# Patient Record
Sex: Male | Born: 1965 | Hispanic: No | Marital: Single | Smoking: Never smoker
Health system: Southern US, Community
[De-identification: ages and names within clinical notes are randomized; demographics above are authoritative.]

## PROBLEM LIST (undated history)

## (undated) DIAGNOSIS — R972 Elevated prostate specific antigen [PSA]: Secondary | ICD-10-CM

## (undated) DIAGNOSIS — N400 Enlarged prostate without lower urinary tract symptoms: Secondary | ICD-10-CM

## (undated) DIAGNOSIS — K219 Gastro-esophageal reflux disease without esophagitis: Secondary | ICD-10-CM

## (undated) DIAGNOSIS — Z8611 Personal history of tuberculosis: Secondary | ICD-10-CM

## (undated) DIAGNOSIS — Z8489 Family history of other specified conditions: Secondary | ICD-10-CM

## (undated) DIAGNOSIS — J45909 Unspecified asthma, uncomplicated: Secondary | ICD-10-CM

## (undated) DIAGNOSIS — T8859XA Other complications of anesthesia, initial encounter: Secondary | ICD-10-CM

## (undated) DIAGNOSIS — M5431 Sciatica, right side: Secondary | ICD-10-CM

## (undated) HISTORY — DX: Elevated prostate specific antigen (PSA): R97.20

## (undated) HISTORY — PX: NO PAST SURGERIES: SHX2092

---

## 2003-12-29 ENCOUNTER — Other Ambulatory Visit: Payer: Self-pay

## 2005-05-29 ENCOUNTER — Emergency Department: Payer: Self-pay | Admitting: Emergency Medicine

## 2005-06-06 ENCOUNTER — Emergency Department: Payer: Self-pay | Admitting: Emergency Medicine

## 2005-06-06 ENCOUNTER — Other Ambulatory Visit: Payer: Self-pay

## 2005-06-08 ENCOUNTER — Ambulatory Visit: Payer: Self-pay | Admitting: Emergency Medicine

## 2005-08-05 ENCOUNTER — Other Ambulatory Visit: Payer: Self-pay

## 2005-08-05 ENCOUNTER — Emergency Department: Payer: Self-pay | Admitting: General Practice

## 2005-09-20 ENCOUNTER — Ambulatory Visit: Payer: Self-pay | Admitting: Gastroenterology

## 2006-02-26 ENCOUNTER — Emergency Department: Payer: Self-pay | Admitting: Emergency Medicine

## 2006-02-26 ENCOUNTER — Other Ambulatory Visit: Payer: Self-pay

## 2006-11-07 ENCOUNTER — Other Ambulatory Visit: Payer: Self-pay

## 2006-11-07 ENCOUNTER — Emergency Department: Payer: Self-pay | Admitting: Unknown Physician Specialty

## 2006-11-12 ENCOUNTER — Emergency Department: Payer: Self-pay | Admitting: Emergency Medicine

## 2006-12-23 ENCOUNTER — Emergency Department: Payer: Self-pay | Admitting: Emergency Medicine

## 2007-08-17 ENCOUNTER — Emergency Department: Payer: Self-pay | Admitting: Emergency Medicine

## 2008-01-16 ENCOUNTER — Inpatient Hospital Stay: Payer: Self-pay | Admitting: Internal Medicine

## 2008-01-16 ENCOUNTER — Other Ambulatory Visit: Payer: Self-pay

## 2009-02-17 ENCOUNTER — Emergency Department: Payer: Self-pay | Admitting: Emergency Medicine

## 2009-10-28 ENCOUNTER — Emergency Department: Payer: Self-pay | Admitting: Internal Medicine

## 2010-02-01 ENCOUNTER — Emergency Department: Payer: Self-pay | Admitting: Emergency Medicine

## 2010-05-26 ENCOUNTER — Emergency Department: Payer: Self-pay | Admitting: Emergency Medicine

## 2010-06-01 ENCOUNTER — Emergency Department: Payer: Self-pay | Admitting: Emergency Medicine

## 2010-08-08 ENCOUNTER — Emergency Department: Payer: Self-pay | Admitting: Emergency Medicine

## 2010-08-26 ENCOUNTER — Emergency Department: Payer: Self-pay | Admitting: Emergency Medicine

## 2011-04-05 ENCOUNTER — Emergency Department: Payer: Self-pay | Admitting: Emergency Medicine

## 2011-11-06 ENCOUNTER — Emergency Department: Payer: Self-pay | Admitting: *Deleted

## 2011-11-12 ENCOUNTER — Emergency Department: Payer: Self-pay | Admitting: Emergency Medicine

## 2012-05-02 ENCOUNTER — Ambulatory Visit: Payer: Self-pay | Admitting: Physical Medicine and Rehabilitation

## 2012-05-19 ENCOUNTER — Emergency Department: Payer: Self-pay | Admitting: Emergency Medicine

## 2012-05-20 LAB — CBC
HCT: 39.1 % — ABNORMAL LOW (ref 40.0–52.0)
HGB: 13.6 g/dL (ref 13.0–18.0)
MCH: 31.9 pg (ref 26.0–34.0)
MCV: 92 fL (ref 80–100)
Platelet: 173 10*3/uL (ref 150–440)
RBC: 4.27 10*6/uL — ABNORMAL LOW (ref 4.40–5.90)
RDW: 13.1 % (ref 11.5–14.5)
WBC: 3.6 10*3/uL — ABNORMAL LOW (ref 3.8–10.6)

## 2012-05-20 LAB — TROPONIN I
Troponin-I: <0.02 ng/mL
Troponin-I: <0.02 ng/mL

## 2012-05-20 LAB — BASIC METABOLIC PANEL
Calcium, Total: 8.8 mg/dL (ref 8.5–10.1)
EGFR (Non-African Amer.): >60
Osmolality: 281 (ref 275–301)
Potassium: 3.6 mmol/L (ref 3.5–5.1)

## 2012-05-20 LAB — CK TOTAL AND CKMB (NOT AT ARMC)
CK, Total: 460 U/L — ABNORMAL HIGH (ref 35–232)
CK, Total: 545 U/L — ABNORMAL HIGH (ref 35–232)
CK-MB: 1.6 ng/mL (ref 0.5–3.6)

## 2012-11-05 ENCOUNTER — Emergency Department: Payer: Self-pay | Admitting: Emergency Medicine

## 2012-11-05 LAB — CBC
HCT: 39.6 % — ABNORMAL LOW (ref 40.0–52.0)
MCH: 31.8 pg (ref 26.0–34.0)
MCHC: 35.7 g/dL (ref 32.0–36.0)
RBC: 4.45 10*6/uL (ref 4.40–5.90)

## 2012-11-05 LAB — TROPONIN I: Troponin-I: <0.02 ng/mL

## 2012-11-05 LAB — BASIC METABOLIC PANEL
Anion Gap: 7 (ref 7–16)
BUN: 10 mg/dL (ref 7–18)
Calcium, Total: 8.6 mg/dL (ref 8.5–10.1)
Chloride: 104 mmol/L (ref 98–107)
Co2: 27 mmol/L (ref 21–32)
Creatinine: 0.97 mg/dL (ref 0.60–1.30)
EGFR (African American): >60
EGFR (Non-African Amer.): >60
Glucose: 106 mg/dL — ABNORMAL HIGH (ref 65–99)
Osmolality: 275 (ref 275–301)
Potassium: 3.4 mmol/L — ABNORMAL LOW (ref 3.5–5.1)

## 2013-05-17 ENCOUNTER — Emergency Department: Payer: Self-pay | Admitting: Emergency Medicine

## 2013-05-17 LAB — BASIC METABOLIC PANEL
Anion Gap: 6 — ABNORMAL LOW (ref 7–16)
BUN: 18 mg/dL (ref 7–18)
Co2: 26 mmol/L (ref 21–32)
Creatinine: 1.26 mg/dL (ref 0.60–1.30)
EGFR (African American): >60
Glucose: 89 mg/dL (ref 65–99)
Osmolality: 275 (ref 275–301)
Potassium: 4.1 mmol/L (ref 3.5–5.1)
Sodium: 137 mmol/L (ref 136–145)

## 2013-05-17 LAB — DRUG SCREEN, URINE
Amphetamines, Ur Screen: NEGATIVE (ref ?–1000)
Benzodiazepine, Ur Scrn: NEGATIVE (ref ?–200)
Cannabinoid 50 Ng, Ur ~~LOC~~: NEGATIVE (ref ?–50)
Cocaine Metabolite,Ur ~~LOC~~: NEGATIVE (ref ?–300)
MDMA (Ecstasy)Ur Screen: NEGATIVE (ref ?–500)
Methadone, Ur Screen: NEGATIVE (ref ?–300)
Opiate, Ur Screen: NEGATIVE (ref ?–300)
Phencyclidine (PCP) Ur S: NEGATIVE (ref ?–25)

## 2013-05-17 LAB — TROPONIN I: Troponin-I: <0.02 ng/mL

## 2013-05-17 LAB — CBC
HGB: 13.6 g/dL (ref 13.0–18.0)
MCV: 90 fL (ref 80–100)
Platelet: 201 10*3/uL (ref 150–440)
RDW: 13.1 % (ref 11.5–14.5)

## 2013-06-21 ENCOUNTER — Emergency Department: Payer: Self-pay | Admitting: Emergency Medicine

## 2013-06-21 LAB — COMPREHENSIVE METABOLIC PANEL
Anion Gap: 5 — ABNORMAL LOW (ref 7–16)
Bilirubin,Total: 0.2 mg/dL (ref 0.2–1.0)
Calcium, Total: 9.1 mg/dL (ref 8.5–10.1)
Chloride: 108 mmol/L — ABNORMAL HIGH (ref 98–107)
Co2: 25 mmol/L (ref 21–32)
Creatinine: 1.11 mg/dL (ref 0.60–1.30)
EGFR (Non-African Amer.): >60
Glucose: 102 mg/dL — ABNORMAL HIGH (ref 65–99)
Osmolality: 277 (ref 275–301)
Potassium: 3.5 mmol/L (ref 3.5–5.1)
SGPT (ALT): 22 U/L (ref 12–78)
Sodium: 138 mmol/L (ref 136–145)
Total Protein: 7.7 g/dL (ref 6.4–8.2)

## 2013-06-21 LAB — CBC WITH DIFFERENTIAL/PLATELET
Comment - H1-Com1: NORMAL
Comment - H1-Com2: NORMAL
Eosinophil: 3 %
HCT: 40.4 % (ref 40.0–52.0)
HGB: 13.9 g/dL (ref 13.0–18.0)
Lymphocytes: 61 %
MCH: 31.1 pg (ref 26.0–34.0)
Monocytes: 10 %
Platelet: 198 10*3/uL (ref 150–440)
RBC: 4.49 10*6/uL (ref 4.40–5.90)
RDW: 13.1 % (ref 11.5–14.5)
Segmented Neutrophils: 26 %
WBC: 5.1 10*3/uL (ref 3.8–10.6)

## 2014-10-01 ENCOUNTER — Emergency Department: Payer: Self-pay | Admitting: Emergency Medicine

## 2016-09-02 ENCOUNTER — Encounter: Payer: Self-pay | Admitting: Emergency Medicine

## 2016-09-02 ENCOUNTER — Emergency Department
Admission: EM | Admit: 2016-09-02 | Discharge: 2016-09-02 | Disposition: A | Discharge status: Home / Self Care | Payer: BLUE CROSS/BLUE SHIELD | Attending: Emergency Medicine | Admitting: Emergency Medicine

## 2016-09-02 ENCOUNTER — Emergency Department: Payer: BLUE CROSS/BLUE SHIELD

## 2016-09-02 DIAGNOSIS — R200 Anesthesia of skin: Secondary | ICD-10-CM | POA: Insufficient documentation

## 2016-09-02 DIAGNOSIS — R079 Chest pain, unspecified: Secondary | ICD-10-CM | POA: Insufficient documentation

## 2016-09-02 DIAGNOSIS — R300 Dysuria: Secondary | ICD-10-CM | POA: Diagnosis not present

## 2016-09-02 DIAGNOSIS — R3915 Urgency of urination: Secondary | ICD-10-CM | POA: Diagnosis not present

## 2016-09-02 DIAGNOSIS — Z79899 Other long term (current) drug therapy: Secondary | ICD-10-CM | POA: Insufficient documentation

## 2016-09-02 DIAGNOSIS — R531 Weakness: Secondary | ICD-10-CM | POA: Diagnosis not present

## 2016-09-02 HISTORY — DX: Benign prostatic hyperplasia without lower urinary tract symptoms: N40.0

## 2016-09-02 HISTORY — DX: Gastro-esophageal reflux disease without esophagitis: K21.9

## 2016-09-02 LAB — URINALYSIS, COMPLETE (UACMP) WITH MICROSCOPIC
Bacteria, UA: NONE SEEN
Bilirubin Urine: NEGATIVE
Glucose, UA: NEGATIVE mg/dL
HGB URINE DIPSTICK: NEGATIVE
KETONES UR: NEGATIVE mg/dL
LEUKOCYTES UA: NEGATIVE
Nitrite: NEGATIVE
PROTEIN: NEGATIVE mg/dL
RBC / HPF: NONE SEEN RBC/hpf (ref 0–5)
Specific Gravity, Urine: 1.009 (ref 1.005–1.030)
Squamous Epithelial / LPF: NONE SEEN
pH: 8 (ref 5.0–8.0)

## 2016-09-02 LAB — CBC
HCT: 38.3 % — ABNORMAL LOW (ref 40.0–52.0)
HEMOGLOBIN: 13.5 g/dL (ref 13.0–18.0)
MCH: 32.1 pg (ref 26.0–34.0)
MCHC: 35.2 g/dL (ref 32.0–36.0)
MCV: 91 fL (ref 80.0–100.0)
Platelets: 188 10*3/uL (ref 150–440)
RBC: 4.2 MIL/uL — AB (ref 4.40–5.90)
RDW: 13.3 % (ref 11.5–14.5)
WBC: 3.4 10*3/uL — ABNORMAL LOW (ref 3.8–10.6)

## 2016-09-02 LAB — BASIC METABOLIC PANEL
ANION GAP: 4 — AB (ref 5–15)
BUN: 11 mg/dL (ref 6–20)
CALCIUM: 8.9 mg/dL (ref 8.9–10.3)
CHLORIDE: 103 mmol/L (ref 101–111)
CO2: 29 mmol/L (ref 22–32)
Creatinine, Ser: 0.98 mg/dL (ref 0.61–1.24)
GFR calc non Af Amer: >60 mL/min (ref >60)
Glucose, Bld: 113 mg/dL — ABNORMAL HIGH (ref 65–99)
Potassium: 3.5 mmol/L (ref 3.5–5.1)
Sodium: 136 mmol/L (ref 135–145)

## 2016-09-02 LAB — TROPONIN I
Troponin I: <0.03 ng/mL (ref <0.03)
Troponin I: <0.03 ng/mL (ref <0.03)

## 2016-09-02 LAB — GLUCOSE, CAPILLARY: GLUCOSE-CAPILLARY: 125 mg/dL — AB (ref 65–99)

## 2016-09-02 MED ORDER — IOPAMIDOL (ISOVUE-370) INJECTION 76%
100.0000 mL | Freq: Once | INTRAVENOUS | Status: AC | PRN
  Administered 2016-09-02: 100 mL via INTRAVENOUS

## 2016-09-02 MED ORDER — SODIUM CHLORIDE 0.9 % IV BOLUS (SEPSIS)
1000.0000 mL | Freq: Once | INTRAVENOUS | Status: AC
  Administered 2016-09-02: 1000 mL via INTRAVENOUS

## 2016-09-02 MED ORDER — ASPIRIN 81 MG PO CHEW
324.0000 mg | CHEWABLE_TABLET | Freq: Once | ORAL | Status: AC
Start: 2016-09-02 — End: 2016-09-02
  Administered 2016-09-02: 324 mg via ORAL
  Filled 2016-09-02: qty 4

## 2016-09-02 NOTE — ED Notes (Signed)
Pt states that at 10am his left arm got numb and started tingling and then his chest started hurting and radiated between shoulder blades - he states he felt tired - at 130pm he felt better and went to work - now the numbness has started again in left arm and he feels weak all over - Dr Don PerkingVeronese at bedside assessing pt

## 2016-09-02 NOTE — Discharge Instructions (Addendum)

## 2016-09-02 NOTE — ED Triage Notes (Addendum)
Pt reports weakness that started around 939-10. Pt states he also is worried there is something wrong with his prostate as he has been having problems urinating and feeling like he isn't emptying his bladder. Pt had hx of elevated PSA at age 51. Pt states prostate problems started around age 51. Pt states he is also having left arm numbness, but states his arm is painful 6/10 as well as the middle of his back.

## 2016-09-02 NOTE — ED Provider Notes (Signed)
[facility] Emergency Department Provider Note  ____________________________________________  Time seen: Approximately 7:03 PM  I have reviewed the triage vital signs and the nursing notes.   HISTORY  Chief Complaint Weakness and Urinary Retention   HPI Gary Nash is a 51 y.o. male with a history of GERD and BPH who presents for evaluation of chest pain. Patient reports that he woke up this morning and 9:30 AM feeling well. Around 10:30 AM he started feeling chest pain that he describes as a tingling sensation located in his chest, associated with numbness, radiating down his left arm, mild. He also felt extremely weak. He went back to bed. He woke up around 1:30 PM feeling the same however felt a little better last week and went into work. As the day went on patient continues to have the constant chest discomfort radiating to his left arm and started feeling again generalized body weakness. He then went to the bathroom because he had a sudden urge to urinate and reports that he had a little bit of pain when he urinated and felt like he was not able to urinate all of it. Patient reports that he has had intermittent trouble with urination for a while. Has had elevated PSA levels. Follow by PCP however has not seen PCP in a few years. Patient is not sure if he has fh of prostate cancer. He denies urinary or bowel incontinence or retention, low back pain, numbness or weakness of his lower extremities. He tells me that he feels better now. The chest pain has resolved, he is no longer feeling weak. His only complaint at this time is a mild pressure-like pain located in between of his shoulder blades on his upper back. Patient denies saddle anesthesia or trauma to his back. He has had chills today but no nausea, no vomiting, no diarrhea, no cough, no congestion, no shortness of breath. Patient is not a smoker. Denies personal or family history of ischemic heart  disease.  Past Medical History:  Diagnosis Date  . Benign prostate hyperplasia   . GERD (gastroesophageal reflux disease)     There are no active problems to display for this patient.   History reviewed. No pertinent surgical history.  Prior to Admission medications   Medication Sig Start Date End Date Taking? Authorizing Provider  BIOTIN PO Take 1 tablet by mouth daily.   Yes Historical Provider, MD  GARLIC PO Take 1 tablet by mouth daily.   Yes Historical Provider, MD  Multiple Vitamins-Minerals (HAIR SKIN AND NAILS FORMULA PO) Take 1 tablet by mouth daily.   Yes Historical Provider, MD  OVER THE COUNTER MEDICATION Take 1 tablet by mouth daily.   Yes Historical Provider, MD  TURMERIC PO Take by mouth.   Yes Historical Provider, MD    Allergies Patient has no known allergies.  History reviewed. No pertinent family history.  Social History Social History  Substance Use Topics  . Smoking status: Never Smoker  . Smokeless tobacco: Never Used  . Alcohol use Yes    Review of Systems  Constitutional: Negative for fever. + generalized weakness and chills Eyes: Negative for visual changes. ENT: Negative for sore throat. Neck: No neck pain  Cardiovascular: + chest pain. Respiratory: Negative for shortness of breath. Gastrointestinal: Negative for abdominal pain, vomiting or diarrhea. Genitourinary: + dysuria. Musculoskeletal: Negative for back pain. Skin: Negative for rash. Neurological: Negative for headaches, weakness or numbness. + chest and arm numbness Psych: No SI or HI  ____________________________________________   PHYSICAL EXAM:  VITAL SIGNS: Vitals:   09/02/16 2032  BP: (!) 139/91  Pulse: 61  Resp: 16    Constitutional: Alert and oriented, looks anxious. Well appearing and in no apparent distress. HEENT:      Head: Normocephalic and atraumatic.         Eyes: Conjunctivae are normal. Sclera is non-icteric. EOMI. PERRL      Mouth/Throat: Mucous  membranes are moist.       Neck: Supple with no signs of meningismus. Cardiovascular: Regular rate and rhythm. No murmurs, gallops, or rubs. 2+ symmetrical distal pulses are present in all extremities. No JVD. Respiratory: Normal respiratory effort. Lungs are clear to auscultation bilaterally. No wheezes, crackles, or rhonchi.  Gastrointestinal: Soft, non tender, and non distended with positive bowel sounds. No rebound or guarding. Genitourinary: No CVA tenderness. Musculoskeletal: Nontender with normal range of motion in all extremities. No edema, cyanosis, or erythema of extremities. Neurologic: Normal speech and language. Intact strength and sensation 4, 2+ DTRs on bilateral biceps and Achilles. Post void bladders scan showing 60cc Skin: Skin is warm, dry and intact. No rash noted. Psychiatric: Mood and affect are normal. Speech and behavior are normal.  ____________________________________________   LABS (all labs ordered are listed, but only abnormal results are displayed)  Labs Reviewed  BASIC METABOLIC PANEL - Abnormal; Notable for the following:       Result Value   Glucose, Bld 113 (*)    Anion gap 4 (*)    All other components within normal limits  CBC - Abnormal; Notable for the following:    WBC 3.4 (*)    RBC 4.20 (*)    HCT 38.3 (*)    All other components within normal limits  URINALYSIS, COMPLETE (UACMP) WITH MICROSCOPIC - Abnormal; Notable for the following:    Color, Urine STRAW (*)    APPearance CLEAR (*)    All other components within normal limits  GLUCOSE, CAPILLARY - Abnormal; Notable for the following:    Glucose-Capillary 125 (*)    All other components within normal limits  CHLAMYDIA/NGC RT PCR ([facility] ONLY)  TROPONIN I  CBG MONITORING, ED   ____________________________________________  EKG  ED ECG REPORT I, Nita Sickle, the attending physician, personally viewed and interpreted this ECG.  Normal sinus rhythm, rate of 65, normal  intervals, normal axis, no ST elevations or depressions. Normal EKG. ____________________________________________  RADIOLOGY  CTA chest: negative ____________________________________________   PROCEDURES  Procedure(s) performed: None Procedures Critical Care performed:  None ____________________________________________   INITIAL IMPRESSION / ASSESSMENT AND PLAN / ED COURSE   51 y.o. male with a history of GERD and BPH who presents for evaluation of multiple different medical complaints. Patient reports chills and generalized weakness since this morning, had chest numbness and tingling sensation radiating to his left arm, also has had dysuria. All symptoms had resolved during my evaluation. As I was testing patient's lower extremity strength he told me that his chest started to bother him again in the numbness was back. As soon as I stopped doing that the CP went away again. He has no lower back pain, normal neurological exam, and he is able to fully empty his bladder with a postvoid residual volume of 60 cc. He is also requesting to be checked for STDs. His presentation sounds atypical for ACS with constant chest pain since this morning mild and tingling. His EKG has no evidence of ischemia. His first troponin is negative. UA has no evidence  of urinary tract infection or kidney stones. Since patient has had some neurological complaints associated with his CP, will get CTA of the aorta to rule out dissection.Will give ASA. Will cycle cardiac enzymes.  Clinical Course as of Sep 02 2029  Fri Sep 02, 2016  2030 CTA chest negative. 2nd trop due at 2100. Patient remains stable, pain free. Care transferred to Dr. Mayford Knife.  [CV]    Clinical Course User Index [CV] Nita Sickle, MD    Pertinent labs & imaging results that were available during my care of the patient were reviewed by me and considered in my medical decision making (see chart for  details).    ____________________________________________   FINAL CLINICAL IMPRESSION(S) / ED DIAGNOSES  Final diagnoses:  Chest pain, unspecified type  Urinary urgency      NEW MEDICATIONS STARTED DURING THIS VISIT:  New Prescriptions   No medications on file     Note:  This document was prepared using Dragon voice recognition software and may include unintentional dictation errors.    Nita Sickle, MD 09/02/16 2034

## 2016-09-03 LAB — CHLAMYDIA/NGC RT PCR (ARMC ONLY)
CHLAMYDIA TR: NOT DETECTED
N GONORRHOEAE: NOT DETECTED

## 2016-09-15 ENCOUNTER — Other Ambulatory Visit: Payer: Self-pay

## 2016-09-15 ENCOUNTER — Telehealth: Payer: Self-pay

## 2016-09-15 ENCOUNTER — Emergency Department
Admission: EM | Admit: 2016-09-15 | Discharge: 2016-09-15 | Disposition: A | Discharge status: Home / Self Care | Payer: BLUE CROSS/BLUE SHIELD | Attending: Emergency Medicine | Admitting: Emergency Medicine

## 2016-09-15 ENCOUNTER — Emergency Department: Payer: BLUE CROSS/BLUE SHIELD

## 2016-09-15 ENCOUNTER — Encounter: Payer: Self-pay | Admitting: Emergency Medicine

## 2016-09-15 DIAGNOSIS — R519 Headache, unspecified: Secondary | ICD-10-CM

## 2016-09-15 DIAGNOSIS — R0789 Other chest pain: Secondary | ICD-10-CM | POA: Diagnosis not present

## 2016-09-15 DIAGNOSIS — R202 Paresthesia of skin: Secondary | ICD-10-CM | POA: Diagnosis not present

## 2016-09-15 DIAGNOSIS — R079 Chest pain, unspecified: Secondary | ICD-10-CM

## 2016-09-15 DIAGNOSIS — R51 Headache: Secondary | ICD-10-CM | POA: Diagnosis not present

## 2016-09-15 LAB — CBC
HCT: 40.4 % (ref 40.0–52.0)
Hemoglobin: 13.6 g/dL (ref 13.0–18.0)
MCH: 31.3 pg (ref 26.0–34.0)
MCHC: 33.7 g/dL (ref 32.0–36.0)
MCV: 92.8 fL (ref 80.0–100.0)
PLATELETS: 191 10*3/uL (ref 150–440)
RBC: 4.35 MIL/uL — ABNORMAL LOW (ref 4.40–5.90)
RDW: 13.5 % (ref 11.5–14.5)
WBC: 3.8 10*3/uL (ref 3.8–10.6)

## 2016-09-15 LAB — BASIC METABOLIC PANEL
Anion gap: 5 (ref 5–15)
BUN: 17 mg/dL (ref 6–20)
CHLORIDE: 102 mmol/L (ref 101–111)
CO2: 25 mmol/L (ref 22–32)
CREATININE: 0.96 mg/dL (ref 0.61–1.24)
Calcium: 9.4 mg/dL (ref 8.9–10.3)
GFR calc Af Amer: >60 mL/min (ref >60)
GFR calc non Af Amer: >60 mL/min (ref >60)
GLUCOSE: 103 mg/dL — AB (ref 65–99)
Potassium: 3.9 mmol/L (ref 3.5–5.1)
Sodium: 132 mmol/L — ABNORMAL LOW (ref 135–145)

## 2016-09-15 LAB — TROPONIN I
Troponin I: <0.03 ng/mL (ref <0.03)
Troponin I: <0.03 ng/mL (ref <0.03)

## 2016-09-15 MED ORDER — ASPIRIN 81 MG PO CHEW
324.0000 mg | CHEWABLE_TABLET | Freq: Once | ORAL | Status: AC
  Administered 2016-09-15: 324 mg via ORAL
  Filled 2016-09-15: qty 4

## 2016-09-15 MED ORDER — ASPIRIN 81 MG PO CHEW: 81.0000 mg | CHEWABLE_TABLET | Freq: Every day | ORAL | 0 refills | Status: DC

## 2016-09-15 NOTE — ED Notes (Signed)
Patient transported to CT 

## 2016-09-15 NOTE — ED Notes (Signed)
Gary Nash aware of patient placement in Room 26.

## 2016-09-15 NOTE — ED Provider Notes (Signed)
[facility] Emergency Department Provider Note  ____________________________________________   First MD Initiated Contact with Patient 09/15/16 0719     (approximate)  I have reviewed the triage vital signs and the nursing notes.   HISTORY  Chief Complaint Chest Pain; Headache; and Arm Pain   HPI Gary Nash is a 51 y.o. male with a history of BPH as well as GERD who is presenting to the emergency department today with left-sided chest numbness that started yesterday 1:30 PM. He also reports numbness to his left upper extremity extending down the lateral side into his left index finger. He is denying any chest pain at this time. Says the chest pain comes on randomly was started after sexual intercourse yesterday. Was also here in the emergency department on February 23 when the pain also started after sexual intercourse. He denies any shortness of breath, nausea or vomiting. Says the chest pain has come and gone since 1:30 PM yesterday and last for about 1-2 minutes each time. Says that his father had stents in his 13s from heart disease. The patient denies any smoking, drinking or drugs.  When the patient was here in the emergency department 2 weeks ago he had 2 sets of troponins as well as a CTA. All which were negative. He says that he makes of the days for his cardiology appointment and so did not follow up with cardiology in the office. Denies any injury. However, he does say that his left shoulder hurts with movement of the left upper extremity.  Patient also with a right-sided headache which she describes as a 6 out of 10 right now is a 7 out of 10 yesterday. He says that this started before he was having sexual intercourse.   Past Medical History:  Diagnosis Date  . Benign prostate hyperplasia   . GERD (gastroesophageal reflux disease)     There are no active problems to display for this patient.   History reviewed. No pertinent surgical  history.  Prior to Admission medications   Medication Sig Start Date End Date Taking? Authorizing Provider  BIOTIN PO Take 1 tablet by mouth daily.    Historical Provider, MD  GARLIC PO Take 1 tablet by mouth daily.    Historical Provider, MD  Multiple Vitamins-Minerals (HAIR SKIN AND NAILS FORMULA PO) Take 1 tablet by mouth daily.    Historical Provider, MD  OVER THE COUNTER MEDICATION Take 1 tablet by mouth daily.    Historical Provider, MD  TURMERIC PO Take by mouth.    Historical Provider, MD    Allergies Patient has no known allergies.  History reviewed. No pertinent family history.  Social History Social History  Substance Use Topics  . Smoking status: Never Smoker  . Smokeless tobacco: Never Used  . Alcohol use Yes    Review of Systems Constitutional: No fever/chills Eyes: No visual changes. ENT: No sore throat. Cardiovascular: as above Respiratory: Denies shortness of breath. Gastrointestinal: No abdominal pain.  No nausea, no vomiting.  No diarrhea.  No constipation. Genitourinary: Negative for dysuria. Musculoskeletal: Negative for back pain. Skin: Negative for rash. Neurological: Negative for focal weakness   10-point ROS otherwise negative.  ____________________________________________   PHYSICAL EXAM:  VITAL SIGNS: ED Triage Vitals [09/15/16 0502]  Enc Vitals Group     BP      Pulse      Resp      Temp      Temp src      SpO2  Weight 185 lb (83.9 kg)     Height 5\' 10"  (1.778 m)     Head Circumference      Peak Flow      Pain Score 3     Pain Loc      Pain Edu?      Excl. in GC?     Constitutional: Alert and oriented. Well appearing and in no acute distress. Eyes: Conjunctivae are normal. PERRL. EOMI. Head: Atraumatic. Nose: No congestion/rhinnorhea. Mouth/Throat: Mucous membranes are moist.   Neck: No stridor.   Cardiovascular: Normal rate, regular rhythm. Grossly normal heart sounds.  Good peripheral circulation With equal and  bilateral radial pulses. Respiratory: Normal respiratory effort.  No retractions. Lungs CTAB. Gastrointestinal: Soft and nontender. No distention. No abdominal bruits. No CVA tenderness. Musculoskeletal: No lower extremity tenderness nor edema.  No joint effusions. Neurologic:  Normal speech and language. No gross focal neurologic deficits are appreciated. No gait instability. No tenderness to palpation to the cervical spine. Patient reports numbness to his left index finger but none to his left ring finger. 5 out of 5 grip strength bilaterally. Skin:  Skin is warm, dry and intact. No rash noted. Psychiatric: Mood and affect are normal. Speech and behavior are normal.  ____________________________________________   LABS (all labs ordered are listed, but only abnormal results are displayed)  Labs Reviewed  BASIC METABOLIC PANEL - Abnormal; Notable for the following:       Result Value   Sodium 132 (*)    Glucose, Bld 103 (*)    All other components within normal limits  CBC - Abnormal; Notable for the following:    RBC 4.35 (*)    All other components within normal limits  TROPONIN I   ____________________________________________  EKG  ED ECG REPORT I, Arelia LongestSchaevitz,  David M, the attending physician, personally viewed and interpreted this ECG.   Date: 09/15/2016  EKG Time: 0502  Rate: 59  Rhythm: sinus bradycardia  Axis: normal axis  Intervals:none  ST&T Change: No ST segment elevation or depression. No abnormal T-wave inversion.  ____________________________________________  RADIOLOGY  DG Chest 2 View (Final result)  Result time 09/15/16 [id number]  Final result by Charlott Holleraniel Mitchell, MD (09/15/16 [id number]:41:[id number]:42)           Narrative:   CLINICAL DATA: Left-sided chest pain and left arm pain  EXAM: CHEST 2 VIEW  COMPARISON: 09/02/2016  FINDINGS: The heart size and mediastinal contours are within normal limits. Both lungs are clear. The visualized skeletal structures  are unremarkable.  IMPRESSION: No active cardiopulmonary disease.   Electronically Signed By: Ellery Plunkaniel R Mitchell M.D. On: 09/15/2016 05:28          CT Head Wo Contrast (Accession [id number]) (Order [id number])  Imaging  Date: 09/15/2016 Department: [facility] EMERGENCY DEPARTMENT Released By/Authorizing: Myrna Blazeravid Matthew Schaevitz, MD (auto-released)  Exam Information   Status Exam Begun  Exam Ended   Final [99] 09/15/2016 7:54 AM 09/15/2016 7:55 AM  PACS Images   Show images for CT Head Wo Contrast  Study Result   CLINICAL DATA:  Left arm pain and right-sided headaches  EXAM: CT HEAD WITHOUT CONTRAST  TECHNIQUE: Contiguous axial images were obtained from the base of the skull through the vertex without intravenous contrast.  COMPARISON:  08/26/2010  FINDINGS: Brain: No evidence of acute infarction, hemorrhage, hydrocephalus, extra-axial collection or mass lesion/mass effect.  Vascular: No hyperdense vessel or unexpected calcification.  Skull: Normal. Negative for fracture or focal lesion.  Sinuses/Orbits: No acute  finding.  Other: None.  IMPRESSION: No acute intracranial abnormality noted.   Electronically Signed   By: Alcide Clever M.D.   On: 09/15/2016 08:01      ____________________________________________   PROCEDURES  Procedure(s) performed:   Procedures  Critical Care performed:   ____________________________________________   INITIAL IMPRESSION / ASSESSMENT AND PLAN / ED COURSE  Pertinent labs & imaging results that were available during my care of the patient were reviewed by me and considered in my medical decision making (see chart for details).  PERC negative.  Heart score of 3.   Numbness does not follow any exact dermatomal distribution. However, because the numbness is only to have the fingers and he has left shoulder pain with movement it does seem like this could be related to nerve  irritation. It is a bit suspicious that his symptoms started after sex. He also has a right-sided headache which is complaining of.   ----------------------------------------- 8:12 AM on 09/15/2016 -----------------------------------------  Patient with reassuring CAT scan. Discussed case with Dr. Kirke Corin of cardiology who says that he'll be setting the patient up for stress test in the office. Patient also saying that the left arm numbness has been consistent since February. We will check second troponin. If negative he'll be discharged home. The patient understands this plan and is willing to comply. We'll also start him on a daily aspirin.  ____________________________________________   FINAL CLINICAL IMPRESSION(S) / ED DIAGNOSES  Paresthesia. Headache.    NEW MEDICATIONS STARTED DURING THIS VISIT:  New Prescriptions   No medications on file     Note:  This document was prepared using Dragon voice recognition software and may include unintentional dictation errors.    Myrna Blazer, MD 09/15/16 [phone]

## 2016-09-15 NOTE — Telephone Encounter (Signed)
Pt seen in ED for chest pain. Per Dr. Kirke Corin, he needs GXT ASAP and f/u appt. Attempted to contact pt. No answer, no voice mail.  Will call again.

## 2016-09-15 NOTE — ED Triage Notes (Signed)
Pt presents to ED by EMS with c/o left arm pain and a small amount of left sided chest pain. Onset yesterday. similar symptoms previously; last time was 2 weeks ago. Pt currently has no increased work of breathing or acute distress noted at this time. Pt recently seen in this ED for the same.

## 2016-09-15 NOTE — Telephone Encounter (Signed)
GXT scheduled 3/9, 10:30am Dr. Alvino ChapelIngal appt scheduled 3/15, 10:45am S/w pt who is agreeable to GXT on Friday. He will be notified of f/u appt date/time tomorrow when he comes for GXT.

## 2016-09-16 ENCOUNTER — Ambulatory Visit (INDEPENDENT_AMBULATORY_CARE_PROVIDER_SITE_OTHER): Payer: BLUE CROSS/BLUE SHIELD

## 2016-09-16 DIAGNOSIS — R079 Chest pain, unspecified: Secondary | ICD-10-CM | POA: Diagnosis not present

## 2016-09-16 LAB — EXERCISE TOLERANCE TEST
CHL CUP MPHR: 171 {beats}/min
CSEPEW: 12.2 METS
Exercise duration (min): 10 min
Exercise duration (sec): 18 s
Peak HR: 146 {beats}/min
Percent HR: 85 %
Rest HR: 65 {beats}/min

## 2016-09-22 ENCOUNTER — Encounter: Payer: Self-pay | Admitting: Cardiology

## 2016-09-22 ENCOUNTER — Ambulatory Visit (INDEPENDENT_AMBULATORY_CARE_PROVIDER_SITE_OTHER): Payer: BLUE CROSS/BLUE SHIELD | Admitting: Cardiology

## 2016-09-22 VITALS — BP 122/78 | HR 67 | Ht 70.0 in | Wt 182.0 lb

## 2016-09-22 DIAGNOSIS — R079 Chest pain, unspecified: Secondary | ICD-10-CM | POA: Diagnosis not present

## 2016-09-22 DIAGNOSIS — R0602 Shortness of breath: Secondary | ICD-10-CM

## 2016-09-22 NOTE — Patient Instructions (Addendum)
Testing/Procedures: Your physician has requested that you have an echocardiogram. Echocardiography is a painless test that uses sound waves to create images of your heart. It provides your doctor with information about the size and shape of your heart and how well your heart's chambers and valves are working. This procedure takes approximately one hour. There are no restrictions for this procedure.  [facility] MYOVIEW  Your caregiver has ordered a Stress Test with nuclear imaging. The purpose of this test is to evaluate the blood supply to your heart muscle. This procedure is referred to as a "Non-Invasive Stress Test." This is because other than having an IV started in your vein, nothing is inserted or "invades" your body. Cardiac stress tests are done to find areas of poor blood flow to the heart by determining the extent of coronary artery disease (CAD). Some patients exercise on a treadmill, which naturally increases the blood flow to your heart, while others who are  unable to walk on a treadmill due to physical limitations have a pharmacologic/chemical stress agent called Lexiscan . This medicine will mimic walking on a treadmill by temporarily increasing your coronary blood flow.   Please note: these test may take anywhere between 2-4 hours to complete  PLEASE REPORT TO [facility]  THE VOLUNTEERS AT THE FIRST DESK WILL DIRECT YOU WHERE TO GO  Date of Procedure:__Tuesday September 27, 2016 at 09:00AM__  Arrival Time for Procedure:_Arrive at 08:45 AM to register_   PLEASE NOTIFY THE OFFICE AT LEAST 24 HOURS IN ADVANCE IF YOU ARE UNABLE TO KEEP YOUR APPOINTMENT.  [phone] AND  PLEASE NOTIFY [facility] AT LEAST 24 HOURS IN ADVANCE IF YOU ARE UNABLE TO KEEP YOUR APPOINTMENT. [phone]  How to prepare for your Myoview test:  1. Do not eat or drink after midnight 2. No caffeine for 24 hours prior to test 3. No smoking 24 hours prior to test. 4. Your medication may  be taken with water.  If your doctor stopped a medication because of this test, do not take that medication. 5. Ladies, please do not wear dresses.  Skirts or pants are appropriate. Please wear a short sleeve shirt. 6. No perfume, cologne or lotion. 7. Wear comfortable walking shoes. No heels!   Follow-Up: Your physician recommends that you schedule a follow-up appointment as needed. We will call you with results and if needed schedule follow up at that time.   It was a pleasure seeing you today here in the office. Please do not hesitate to give us a call back if you have any further questions. [phone]  [place] CellarPamela A. RN, BSN    Echocardiogram An echocardiogram, or echocardiography, uses sound waves (ultrasound) to produce an image of your heart. The echocardiogram is simple, painless, obtained within a short period of time, and offers valuable information to your health care provider. The images from an echocardiogram can provide information such as:  Evidence of coronary artery disease (CAD).  Heart size.  Heart muscle function.  Heart valve function.  Aneurysm detection.  Evidence of a past heart attack.  Fluid buildup around the heart.  Heart muscle thickening.  Assess heart valve function. Tell a health care provider about:  Any allergies you have.  All medicines you are taking, including vitamins, herbs, eye drops, creams, and over-the-counter medicines.  Any problems you or family members have had with anesthetic medicines.  Any blood disorders you have.  Any surgeries you have had.  Any medical conditions you have.  Whether you are pregnant or may be pregnant. What happens before the procedure? No special preparation is needed. Eat and drink normally. What happens during the procedure?  In order to produce an image of your heart, gel will be applied to your chest and a wand-like tool (transducer) will be moved over your chest. The gel will help transmit  the sound waves from the transducer. The sound waves will harmlessly bounce off your heart to allow the heart images to be captured in real-time motion. These images will then be recorded.  You may need an IV to receive a medicine that improves the quality of the pictures. What happens after the procedure? You may return to your normal schedule including diet, activities, and medicines, unless your health care provider tells you otherwise. This information is not intended to replace advice given to you by your health care provider. Make sure you discuss any questions you have with your health care provider. Document Released: 06/24/2000 Document Revised: 02/13/2016 Document Reviewed: 03/04/2013 Elsevier Interactive Patient Education  2017 [employer]. Cardiac Nuclear Scan A cardiac nuclear scan is a test that measures blood flow to the heart when a person is resting and when he or she is exercising. The test looks for problems such as:  Not enough blood reaching a portion of the heart.  The heart muscle not working normally. You may need this test if:  You have heart disease.  You have had abnormal lab results.  You have had heart surgery or angioplasty.  You have chest pain.  You have shortness of breath. In this test, a radioactive dye (tracer) is injected into your bloodstream. After the tracer has traveled to your heart, an imaging device is used to measure how much of the tracer is absorbed by or distributed to various areas of your heart. This procedure is usually done at a hospital and takes 2-4 hours. Tell a health care provider about:  Any allergies you have.  All medicines you are taking, including vitamins, herbs, eye drops, creams, and over-the-counter medicines.  Any problems you or family members have had with the use of anesthetic medicines.  Any blood disorders you have.  Any surgeries you have had.  Any medical conditions you have.  Whether you are pregnant  or may be pregnant. What are the risks? Generally, this is a safe procedure. However, problems may occur, including:  Serious chest pain and heart attack. This is only a risk if the stress portion of the test is done.  Rapid heartbeat.  Sensation of warmth in your chest. This usually passes quickly. What happens before the procedure?  Ask your health care provider about changing or stopping your regular medicines. This is especially important if you are taking diabetes medicines or blood thinners.  Remove your jewelry on the day of the procedure. What happens during the procedure?  An IV tube will be inserted into one of your veins.  Your health care provider will inject a small amount of radioactive tracer through the tube.  You will wait for 20-40 minutes while the tracer travels through your bloodstream.  Your heart activity will be monitored with an electrocardiogram (ECG).  You will lie down on an exam table.  Images of your heart will be taken for about 15-20 minutes.  You may be asked to exercise on a treadmill or stationary bike. While you exercise, your heart's activity will be monitored with an ECG, and your blood pressure will be checked. If you are  unable to exercise, you may be given a medicine to increase blood flow to parts of your heart.  When blood flow to your heart has peaked, a tracer will again be injected through the IV tube.  After 20-40 minutes, you will get back on the exam table and have more images taken of your heart.  When the procedure is over, your IV tube will be removed. The procedure may vary among health care providers and hospitals. Depending on the type of tracer used, scans may need to be repeated 3-4 hours later. What happens after the procedure?  Unless your health care provider tells you otherwise, you may return to your normal schedule, including diet, activities, and medicines.  Unless your health care provider tells you otherwise,  you may increase your fluid intake. This will help flush the contrast dye from your body. Drink enough fluid to keep your urine clear or pale yellow.  It is up to you to get your test results. Ask your health care provider, or the department that is doing the test, when your results will be ready. Summary  A cardiac nuclear scan measures the blood flow to the heart when a person is resting and when he or she is exercising.  You may need this test if you are at risk for heart disease.  Tell your health care provider if you are pregnant.  Unless your health care provider tells you otherwise, increase your fluid intake. This will help flush the contrast dye from your body. Drink enough fluid to keep your urine clear or pale yellow. This information is not intended to replace advice given to you by your health care provider. Make sure you discuss any questions you have with your health care provider. Document Released: 07/22/2004 Document Revised: 06/29/2016 Document Reviewed: 06/05/2013 Elsevier Interactive Patient Education  2017 [employer].

## 2016-09-22 NOTE — Progress Notes (Signed)
Cardiology Office Note   Date:  09/22/2016   ID:  Gary Nash, DOB 1966/03/24, MRN [id number]  Referring Doctor:  No PCP Per Patient   Cardiologist:   Almond Lint, MD   Reason for consultation:  Chief Complaint  Patient presents with  . other    F/u stress test/ ED due to chest pain. Meds reviewed verbally with pt.      History of Present Illness: Gary Nash is a 51 y.o. male who presents for Chest pain. He is here after next size treadmill test.  Patient presented to the ER 09/16/2016 for chest pain, left arm pain. He was ruled out for ACS and was asked to follow-up with cardiology office. Before the initial visit, he was scheduled for an exercise stress test. There was deemed unremarkable.  Today, patient reports that he continues to have chest pain. Last was yesterday while he was changing a tire. He had discomfort in the center of the chest, associated with left arm pain, 6 out of 10 severity, resolved after 15-20 minutes of rest. He will also felt lightheaded and did not feel well at that time.  He continues to have shortness of breath with exertion as well.  Patient reports the father had heart disease in his 75s. On this had an MI in her 6s.   ROS:  Please see the history of present illness. Aside from mentioned under HPI, all other systems are reviewed and negative.     Past Medical History:  Diagnosis Date  . Benign prostate hyperplasia   . GERD (gastroesophageal reflux disease)     History reviewed. No pertinent surgical history.   reports that he has never smoked. He has never used smokeless tobacco. He reports that he drinks alcohol.   family history is not on file.   Outpatient Medications Prior to Visit  Medication Sig Dispense Refill  . Multiple Vitamins-Minerals (HAIR SKIN AND NAILS FORMULA PO) Take 1 tablet by mouth daily.    Marland Kitchen aspirin (ASPIRIN CHILDRENS) 81 MG chewable tablet Chew 1 tablet (81 mg total) by mouth daily. (Patient not  taking: Reported on 09/22/2016) 36 tablet 0  . BIOTIN PO Take 1 tablet by mouth daily.    Marland Kitchen GARLIC PO Take 1 tablet by mouth daily.    Marland Kitchen OVER THE COUNTER MEDICATION Take 1 tablet by mouth daily.    . TURMERIC PO Take by mouth.     No facility-administered medications prior to visit.      Allergies: Patient has no known allergies.    PHYSICAL EXAM: VS:  BP 122/78 (BP Location: Left Arm, Patient Position: Sitting, Cuff Size: Normal)   Pulse 67   Ht  (1.778 m)   Wt 182 lb (82.6 kg)   BMI 26.11 kg/m  , Body mass index is 26.11 kg/m. Wt Readings from Last 3 Encounters:  09/22/16 182 lb (82.6 kg)  09/15/16 185 lb (83.9 kg)  09/02/16 185 lb (83.9 kg)    GENERAL:  well developed, well nourished, not in acute distress HEENT: normocephalic, pink conjunctivae, anicteric sclerae, no xanthelasma, normal dentition, oropharynx clear NECK:  no neck vein engorgement, JVP normal, no hepatojugular reflux, carotid upstroke brisk and symmetric, no bruit, no thyromegaly, no lymphadenopathy LUNGS:  good respiratory effort, clear to auscultation bilaterally CV:  PMI not displaced, no thrills, no lifts, S1 and S2 within normal limits, no palpable S3 or S4, no murmurs, no rubs, no gallops ABD:  Soft, nontender, nondistended, normoactive bowel  sounds, no abdominal aortic bruit, no hepatomegaly, no splenomegaly MS: nontender back, no kyphosis, no scoliosis, no joint deformities EXT:  2+ DP/PT pulses, no edema, no varicosities, no cyanosis, no clubbing SKIN: warm, nondiaphoretic, normal turgor, no ulcers NEUROPSYCH: alert, oriented to person, place, and time, sensory/motor grossly intact, normal mood, appropriate affect  Recent Labs: 09/15/2016: BUN 17; Creatinine, Ser 0.96; Hemoglobin 13.6; Platelets 191; Potassium 3.9; Sodium 132   Lipid Panel No results found for: CHOL, TRIG, HDL, CHOLHDL, VLDL, LDLCALC, LDLDIRECT   Other studies Reviewed:  EKG:  The ekg from 09/16/2016 was personally reviewed  by me and it revealed sinus rhythm 59 BPM, sinus arrhythmia.  Additional studies/ records that were reviewed personally reviewed by me today include:  Exercise stress test 09/16/2016:  Blood pressure demonstrated a normal response to exercise.  There was no ST segment deviation noted during stress.  No T wave inversion was noted during stress.   Normal treadmill stress test with no evidence of ischemia. Excellent exercise capacity with normal blood pressure response to exercise. The patient exercised for 10 minutes and 18 seconds.   ASSESSMENT AND PLAN: Chest pain, shortness of breath Symptoms persist despite a negative treadmill test. Recommend to continue further evaluation with exercise nuclear stress test, symptoms or with exertion. Recommend echocardiogram as well. Continue aspirin 81 mg by mouth daily. Patient to call 911 for unrelenting chest pain.   Current medicines are reviewed at length with the patient today.  The patient does not have concerns regarding medicines.  Labs/ tests ordered today include:  Orders Placed This Encounter  Procedures  . NM Myocar Multi W/Spect W/Wall Motion / EF  . ECHOCARDIOGRAM COMPLETE    I had a lengthy and detailed discussion with the patient regarding diagnoses, prognosis, diagnostic options, treatment options , and side effects of medications.   I counseled the patient on importance of lifestyle modification including heart healthy diet, regular physical activity once cardiac workup is completed   Disposition:   FU with Cardiology after tests   Thank you for this consultation. We will forwarding this consultation to referring physician.   Signed, Almond Lint, MD  09/22/2016 2:03 PM    [facility]  This note was generated in part with voice recognition software and I apologize for any typographical errors that were not detected and corrected.

## 2016-09-27 ENCOUNTER — Ambulatory Visit
Admission: RE | Admit: 2016-09-27 | Discharge: 2016-09-27 | Disposition: A | Discharge status: Home / Self Care | Payer: BLUE CROSS/BLUE SHIELD | Source: Ambulatory Visit | Attending: Cardiology | Admitting: Cardiology

## 2016-09-27 ENCOUNTER — Telehealth: Payer: Self-pay | Admitting: Cardiology

## 2016-09-27 DIAGNOSIS — R079 Chest pain, unspecified: Secondary | ICD-10-CM

## 2016-09-27 DIAGNOSIS — R0602 Shortness of breath: Secondary | ICD-10-CM

## 2016-09-27 NOTE — Telephone Encounter (Signed)
Left voicemail message for patient to call back to discuss stress test.  

## 2016-09-27 NOTE — Telephone Encounter (Signed)
Left voicemail message to call back  

## 2016-09-27 NOTE — Telephone Encounter (Signed)
Nuc Med called, states pt did not have stress test performed due to high copay.

## 2016-09-28 NOTE — Telephone Encounter (Signed)
Left voicemail message to call back  

## 2016-09-29 ENCOUNTER — Encounter: Payer: Self-pay | Admitting: *Deleted

## 2016-09-29 NOTE — Telephone Encounter (Signed)
Sent patient letter to call back

## 2016-09-29 NOTE — Telephone Encounter (Signed)
Left detailed voicemail message to call back to discuss possible payment plans and other options for stress test.

## 2016-10-28 ENCOUNTER — Other Ambulatory Visit: Payer: BLUE CROSS/BLUE SHIELD

## 2016-10-28 ENCOUNTER — Encounter: Payer: Self-pay | Admitting: Cardiology

## 2016-11-21 DIAGNOSIS — Z5321 Procedure and treatment not carried out due to patient leaving prior to being seen by health care provider: Secondary | ICD-10-CM | POA: Diagnosis not present

## 2016-11-21 DIAGNOSIS — Z7982 Long term (current) use of aspirin: Secondary | ICD-10-CM | POA: Insufficient documentation

## 2016-11-21 DIAGNOSIS — R42 Dizziness and giddiness: Secondary | ICD-10-CM | POA: Insufficient documentation

## 2016-11-21 DIAGNOSIS — R51 Headache: Secondary | ICD-10-CM | POA: Diagnosis not present

## 2016-11-21 LAB — URINALYSIS, COMPLETE (UACMP) WITH MICROSCOPIC
BACTERIA UA: NONE SEEN
BILIRUBIN URINE: NEGATIVE
GLUCOSE, UA: NEGATIVE mg/dL
HGB URINE DIPSTICK: NEGATIVE
Ketones, ur: NEGATIVE mg/dL
LEUKOCYTES UA: NEGATIVE
NITRITE: NEGATIVE
PROTEIN: NEGATIVE mg/dL
SPECIFIC GRAVITY, URINE: 1.016 (ref 1.005–1.030)
Squamous Epithelial / LPF: NONE SEEN
WBC UA: NONE SEEN WBC/hpf (ref 0–5)
pH: 6 (ref 5.0–8.0)

## 2016-11-21 LAB — CBC
HEMATOCRIT: 41.7 % (ref 40.0–52.0)
HEMOGLOBIN: 14.4 g/dL (ref 13.0–18.0)
MCH: 30.9 pg (ref 26.0–34.0)
MCHC: 34.6 g/dL (ref 32.0–36.0)
MCV: 89.4 fL (ref 80.0–100.0)
Platelets: 203 10*3/uL (ref 150–440)
RBC: 4.66 MIL/uL (ref 4.40–5.90)
RDW: 13.1 % (ref 11.5–14.5)
WBC: 4.3 10*3/uL (ref 3.8–10.6)

## 2016-11-21 LAB — BASIC METABOLIC PANEL
ANION GAP: 5 (ref 5–15)
BUN: 13 mg/dL (ref 6–20)
CO2: 30 mmol/L (ref 22–32)
Calcium: 9.4 mg/dL (ref 8.9–10.3)
Chloride: 101 mmol/L (ref 101–111)
Creatinine, Ser: 1.06 mg/dL (ref 0.61–1.24)
Glucose, Bld: 94 mg/dL (ref 65–99)
POTASSIUM: 3.9 mmol/L (ref 3.5–5.1)
SODIUM: 136 mmol/L (ref 135–145)

## 2016-11-21 NOTE — ED Triage Notes (Signed)
Pt arrives to ED via POV with c/o "heat exposure", headache, "tiredness", and "tingling in my hands". Pt reports working outside today around 1230am when s/x's started. Pt took 81mg  ASA at 7pm. Pt reports s/x's have been intermittent since 1230 until 730 this evening. Pt denies cardiac history, no h/x of CVA. Pt is A&Ox4, in NAD, with respirations even regular and unlabored. Pt denies CP or SHOB, skin is warm and dry, color appropriate for ethnicity.

## 2016-11-22 ENCOUNTER — Encounter: Payer: Self-pay | Admitting: Cardiology

## 2016-11-22 ENCOUNTER — Emergency Department
Admission: EM | Admit: 2016-11-22 | Discharge: 2016-11-22 | Disposition: A | Discharge status: Home / Self Care | Payer: BLUE CROSS/BLUE SHIELD | Attending: Emergency Medicine | Admitting: Emergency Medicine

## 2016-11-22 ENCOUNTER — Telehealth: Payer: Self-pay | Admitting: Emergency Medicine

## 2016-11-22 NOTE — Telephone Encounter (Signed)
Called patient due to lwot to inquire about condition and follow up plans. Mailbox full, so unable to leave message. 

## 2016-12-06 ENCOUNTER — Emergency Department
Admission: EM | Admit: 2016-12-06 | Discharge: 2016-12-06 | Disposition: A | Discharge status: Home / Self Care | Payer: BLUE CROSS/BLUE SHIELD | Attending: Emergency Medicine | Admitting: Emergency Medicine

## 2016-12-06 ENCOUNTER — Emergency Department: Payer: BLUE CROSS/BLUE SHIELD

## 2016-12-06 ENCOUNTER — Encounter: Payer: Self-pay | Admitting: Emergency Medicine

## 2016-12-06 DIAGNOSIS — N5082 Scrotal pain: Secondary | ICD-10-CM | POA: Insufficient documentation

## 2016-12-06 DIAGNOSIS — Z7982 Long term (current) use of aspirin: Secondary | ICD-10-CM | POA: Diagnosis not present

## 2016-12-06 DIAGNOSIS — R079 Chest pain, unspecified: Secondary | ICD-10-CM | POA: Insufficient documentation

## 2016-12-06 DIAGNOSIS — R52 Pain, unspecified: Secondary | ICD-10-CM

## 2016-12-06 LAB — CBC
HEMATOCRIT: 41.3 % (ref 40.0–52.0)
HEMOGLOBIN: 14.1 g/dL (ref 13.0–18.0)
MCH: 31.2 pg (ref 26.0–34.0)
MCHC: 34.3 g/dL (ref 32.0–36.0)
MCV: 90.8 fL (ref 80.0–100.0)
Platelets: 212 10*3/uL (ref 150–440)
RBC: 4.54 MIL/uL (ref 4.40–5.90)
RDW: 13.3 % (ref 11.5–14.5)
WBC: 3.1 10*3/uL — ABNORMAL LOW (ref 3.8–10.6)

## 2016-12-06 LAB — URINALYSIS, COMPLETE (UACMP) WITH MICROSCOPIC
BACTERIA UA: NONE SEEN
Bilirubin Urine: NEGATIVE
Glucose, UA: NEGATIVE mg/dL
HGB URINE DIPSTICK: NEGATIVE
Ketones, ur: NEGATIVE mg/dL
Leukocytes, UA: NEGATIVE
Nitrite: NEGATIVE
PROTEIN: NEGATIVE mg/dL
SPECIFIC GRAVITY, URINE: 1.016 (ref 1.005–1.030)
SQUAMOUS EPITHELIAL / LPF: NONE SEEN
pH: 6 (ref 5.0–8.0)

## 2016-12-06 LAB — COMPREHENSIVE METABOLIC PANEL
ALT: 16 U/L — ABNORMAL LOW (ref 17–63)
AST: 21 U/L (ref 15–41)
Albumin: 4.4 g/dL (ref 3.5–5.0)
Alkaline Phosphatase: 52 U/L (ref 38–126)
Anion gap: 4 — ABNORMAL LOW (ref 5–15)
BUN: 9 mg/dL (ref 6–20)
CO2: 28 mmol/L (ref 22–32)
Calcium: 9.3 mg/dL (ref 8.9–10.3)
Chloride: 106 mmol/L (ref 101–111)
Creatinine, Ser: 0.93 mg/dL (ref 0.61–1.24)
GFR calc Af Amer: >60 mL/min (ref >60)
GFR calc non Af Amer: >60 mL/min (ref >60)
GLUCOSE: 87 mg/dL (ref 65–99)
POTASSIUM: 3.7 mmol/L (ref 3.5–5.1)
SODIUM: 138 mmol/L (ref 135–145)
Total Bilirubin: 0.5 mg/dL (ref 0.3–1.2)
Total Protein: 7.9 g/dL (ref 6.5–8.1)

## 2016-12-06 LAB — TROPONIN I

## 2016-12-06 LAB — LIPASE, BLOOD: Lipase: 31 U/L (ref 11–51)

## 2016-12-06 MED ORDER — IOPAMIDOL (ISOVUE-370) INJECTION 76%
75.0000 mL | Freq: Once | INTRAVENOUS | Status: AC | PRN
  Administered 2016-12-06: 75 mL via INTRAVENOUS

## 2016-12-06 MED ORDER — KETOROLAC TROMETHAMINE 30 MG/ML IJ SOLN
30.0000 mg | Freq: Once | INTRAMUSCULAR | Status: DC
  Filled 2016-12-06: qty 1

## 2016-12-06 NOTE — ED Triage Notes (Signed)
[facility] and lower back pain.

## 2016-12-06 NOTE — ED Notes (Addendum)
Pt taken to room 8 by pt relations rep Alfredia ClientMary Jo; report called to [facility], care nurse and notified of protocols that were performed in triage

## 2016-12-06 NOTE — ED Provider Notes (Addendum)
[facility] Emergency Department Provider Note   ____________________________________________   First MD Initiated Contact with Patient 12/06/16 1920     (approximate)  I have reviewed the triage vital signs and the nursing notes.   HISTORY  Chief Complaint Abdominal Pain and Dysuria    HPI Gary Nash is a 51 y.o. male patient complains of pain in the lower right chest starting yesterday. Patient reports it seems to be worse with movement or activity and better when he rests. He feels weak and tired all the time. He is not coughing he does not have a fever does not actually have any pain in his abdomen. He has no dysuria pain is not worse with deep breathing. Patient does feel slightly short of breath. Patient also complains of pain in the scrotum above the testicle its been there fairly constantly for month or 2. He has no dysuria pain there is mild.   Past Medical History:  Diagnosis Date  . Benign prostate hyperplasia   . GERD (gastroesophageal reflux disease)     There are no active problems to display for this patient.   History reviewed. No pertinent surgical history.  Prior to Admission medications   Medication Sig Start Date End Date Taking? Authorizing Provider  aspirin (ASPIRIN CHILDRENS) 81 MG chewable tablet Chew 1 tablet (81 mg total) by mouth daily. Patient not taking: Reported on 09/22/2016 09/15/16 09/15/17  Myrna Blazer, MD  Multiple Vitamins-Minerals (HAIR SKIN AND NAILS FORMULA PO) Take 1 tablet by mouth daily.    [provider]  NON FORMULARY Testosterone Booster qd. (P6 alternate)    [provider]    Allergies Patient has no known allergies.  No family history on file.  Social History Social History  Substance Use Topics  . Smoking status: Never Smoker  . Smokeless tobacco: Never Used  . Alcohol use Yes    Review of Systems  Constitutional: No fever/chills Eyes: No visual  changes. ENT: No sore throat. Cardiovascular: See history of present illness Respiratory: See history of present illness Gastrointestinal: No abdominal pain.  No nausea, no vomiting.  No diarrhea.  No constipation. Genitourinary: Negative for dysuria. Musculoskeletal: Negative for back pain. Skin: Negative for rash. Neurological: Negative for headaches, focal weakness or numbness.   ____________________________________________   PHYSICAL EXAM:  VITAL SIGNS: ED Triage Vitals  Enc Vitals Group     BP 12/06/16 1719 124/80     Pulse Rate 12/06/16 1719 (!) 58     Resp 12/06/16 1719 16     Temp 12/06/16 1719 98.3 F (36.8 C)     Temp Source 12/06/16 1719 Oral     SpO2 12/06/16 1719 99 %     Weight 12/06/16 1718 175 lb (79.4 kg)     Height 12/06/16 1718 5\' 10"  (1.778 m)     Head Circumference --      Peak Flow --      Pain Score 12/06/16 1718 7     Pain Loc --      Pain Edu? --      Excl. in GC? --     Constitutional: Alert and oriented. Well appearing and in no acute distress. Eyes: Conjunctivae are normal. PERRL. EOMI. Head: Atraumatic. Nose: No congestion/rhinnorhea. Mouth/Throat: Mucous membranes are moist.  Oropharynx non-erythematous. Neck: No stridor.   Cardiovascular: Normal rate, regular rhythm. Grossly normal heart sounds.  Good peripheral circulation. Respiratory: Normal respiratory effort.  No retractions. Lungs CTAB. Gastrointestinal: Soft and nontender.  No distention. No abdominal bruits. No CVA tenderness. Genitourinary: Normal uncircumcised male no discharge testicles are descended nontender with normal Rectal: Normal rectal exam nontender prostate is normal Musculoskeletal: No lower extremity tenderness nor edema.  No joint effusions. Neurologic:  Normal speech and language. No gross focal neurologic deficits are appreciated. No gait instability. Skin:  Skin is warm, dry and intact. No rash noted. Psychiatric: Mood and affect are normal. Speech and behavior  are normal.  ____________________________________________   LABS (all labs ordered are listed, but only abnormal results are displayed)  Labs Reviewed  COMPREHENSIVE METABOLIC PANEL - Abnormal; Notable for the following:       Result Value   ALT 16 (*)    Anion gap 4 (*)    All other components within normal limits  CBC - Abnormal; Notable for the following:    WBC 3.1 (*)    All other components within normal limits  URINALYSIS, COMPLETE (UACMP) WITH MICROSCOPIC - Abnormal; Notable for the following:    Color, Urine YELLOW (*)    APPearance CLEAR (*)    All other components within normal limits  LIPASE, BLOOD  TROPONIN I   ____________________________________________  [facility] and interpreted by me shows normal sinus rhythm rate of 60 left axis no acute ST-T wave changes ____________________________________________  RADIOLOGY  Chest CT IMPRESSION: No cause of the presenting symptoms is identified. No pulmonary emboli or other acute chest pathology. Few tiny calcified left hilar nodes without visible pulmonary granuloma.   Electronically Signed   By: Paulina FusiMark  Shogry M.D.   On: 12/06/2016 20:15 ____________________________________________   PROCEDURES  Procedure(s) performed:   Procedures  Critical Care performed:   ____________________________________________   INITIAL IMPRESSION / ASSESSMENT AND PLAN / ED COURSE  Pertinent labs & imaging results that were available during my care of the patient were reviewed by me and considered in my medical decision making (see chart for details). There is still no pain on palpation of the patient's abdomen. Chest CT looks good. Troponin is negative EKG is normal patient has had a cardiac workup in the past that was unrevealing. Not sure what is going on with the patient but he appears to be healthy. The pain in his scrotal area is chronic his prostate exam is normal as tested testes are normal bilaterally   Clinical  Course as of Dec 06 2032  Tue Dec 06, 2016  1920 Chloride: 106 [PM]  1922 RBC: 4.54 [PM]    Clinical Course User Index [PM] Arnaldo NatalMalinda, Romaldo Saville F, MD     ____________________________________________   FINAL CLINICAL IMPRESSION(S) / ED DIAGNOSES  Final diagnoses:  Pain  Chest pain, unspecified type      NEW MEDICATIONS STARTED DURING THIS VISIT:  New Prescriptions   No medications on file     Note:  This document was prepared using Dragon voice recognition software and may include unintentional dictation errors.    Arnaldo NatalMalinda, Haillie Radu F, MD 12/06/16 2034    Arnaldo NatalMalinda, Analynn Daum F, MD 12/06/16 2035

## 2016-12-06 NOTE — Discharge Instructions (Signed)
Please follow-up with your regular doctor. Use Advil or Tylenol for the pain. I'm not sure what the pain is from but I do not believe it is your heart and there is no sign of any pneumonia or blood clot or anything else. Her lab work including your abdominal lab work looks good as well. Please return if you're worse if you develop fever vomiting or feels sicker.

## 2016-12-06 NOTE — ED Notes (Addendum)
MD at bedside. 

## 2016-12-06 NOTE — ED Triage Notes (Signed)
C/O right upper quadrant pain radiating to back since Saturday.  Pain is intermittent.  Describes nausea with pain.  Also c/o intermittent scrotal pain x 1 month.  Describes urinary urgency.

## 2016-12-06 NOTE — ED Notes (Signed)
Patient transported to CT 

## 2016-12-06 NOTE — ED Notes (Signed)
RN called lab to inform of troponin add on.

## 2017-04-27 DIAGNOSIS — D72819 Decreased white blood cell count, unspecified: Secondary | ICD-10-CM | POA: Insufficient documentation

## 2017-05-16 NOTE — Progress Notes (Signed)
05/17/2017 10:47 PM   Gary GottronBobby Nash Monrreal 1965-10-31 [id number]  Referring provider: [facility] [address] [place] [phone]  Chief Complaint  Patient presents with  . Urinary Frequency    HPI: Patient is a 51 year old African American male with a history of elevated PSA and BPH with LU TS who presents today as a referral from Dr. Richardine ServiceBehling.    History of elevated PSA Patient underwent a biopsy in 2010 for an elevated PSA.  He is unsure of the PSA value.  His most recent PSA was 1.46 ng/mL on 04/27/2017.    BPH WITH LUTS  (prostate and/or bladder) His IPSS score today is 14, which is moderate lower urinary tract symptomatology.  He is mixed with his quality life due to his urinary symptoms. His PVR is 54 mL.   His major complaints today are urgency, nocturia and a weak urinary stream.  He has had these symptoms for several months.  He denies any dysuria, hematuria or suprapubic pain.  He also denies any recent fevers, chills, nausea or vomiting.   He does not have a family history of PCa. IPSS    Row Name 05/17/17 1500         International Prostate Symptom Score   How often have you had the sensation of not emptying your bladder?  About half the time     How often have you had to urinate less than every two hours?  Not at All     How often have you found you stopped and started again several times when you urinated?  Not at All     How often have you found it difficult to postpone urination?  About half the time     How often have you had a weak urinary stream?  Almost always     How often have you had to strain to start urination?  Not at All     How many times did you typically get up at night to urinate?  3 Times     Total IPSS Score  14       Quality of Life due to urinary symptoms   If you were to spend the rest of your life with your urinary condition just the way it is now how would you feel about that?  Mixed        Score:  1-7  Mild 8-19 Moderate 20-35 Severe  He has also started to feel increased fatigue after sexual intercourse.      PMH: Past Medical History:  Diagnosis Date  . Benign prostate hyperplasia   . Elevated PSA   . GERD (gastroesophageal reflux disease)     Surgical History: Past Surgical History:  Procedure Laterality Date  . NO PAST SURGERIES      Home Medications:  Allergies as of 05/17/2017   No Known Allergies     Medication List        Accurate as of 05/17/17 11:59 PM. Always use your most recent med list.          cholecalciferol 400 units Tabs tablet Commonly known as:  VITAMIN Nash Take 400 Units by mouth.   Selenium 100 MCG Caps Take 1 capsule daily by mouth.   UNABLE TO FIND Take 1 capsule daily by mouth. Med Name: B-17   vitamin C 1000 MG tablet Take 1 tablet daily by mouth.       Allergies: No Known Allergies  Family History: Family  History  Problem Relation Age of Onset  . Erectile dysfunction Father   . Diabetes Father   . Healthy Mother   . Hypertension Brother     Social History:  reports that  has never smoked. he has never used smokeless tobacco. He reports that he drinks alcohol. He reports that he does not use drugs.  ROS: UROLOGY Frequent Urination?: No Hard to postpone urination?: Yes Burning/pain with urination?: No Get up at night to urinate?: Yes Leakage of urine?: No Urine stream starts and stops?: No Trouble starting stream?: No Do you have to strain to urinate?: No Blood in urine?: No Urinary tract infection?: No Sexually transmitted disease?: No Injury to kidneys or bladder?: No Painful intercourse?: No Weak stream?: Yes Erection problems?: No Penile pain?: No  Gastrointestinal Nausea?: No Vomiting?: No Indigestion/heartburn?: Yes Diarrhea?: No Constipation?: No  Constitutional Fever: No Night sweats?: No Weight loss?: No Fatigue?: Yes  Skin Skin rash/lesions?: No Itching?: No  Eyes Blurred vision?:  Yes Double vision?: No  Ears/Nose/Throat Sore throat?: Yes Sinus problems?: Yes  Hematologic/Lymphatic Swollen glands?: Yes Easy bruising?: No  Cardiovascular Leg swelling?: No Chest pain?: Yes  Respiratory Cough?: Yes Shortness of breath?: No  Endocrine Excessive thirst?: Yes  Musculoskeletal Back pain?: Yes Joint pain?: Yes  Neurological Headaches?: No Dizziness?: No  Psychologic Depression?: No Anxiety?: Yes  Physical Exam: BP 137/90   Pulse 74   Ht 5\' 10"  (1.778 m)   Wt 176 lb 14.4 oz (80.2 kg)   BMI 25.38 kg/m   Constitutional: Well nourished. Alert and oriented, No acute distress. HEENT: [place] AT, moist mucus membranes. Trachea midline, no masses. Cardiovascular: No clubbing, cyanosis, or edema. Respiratory: Normal respiratory effort, no increased work of breathing. GI: Abdomen is soft, non tender, non distended, no abdominal masses. Liver and spleen not palpable.  No hernias appreciated.  Stool sample for occult testing is not indicated.   GU: No CVA tenderness.  No bladder fullness or masses.  Patient with normal phallus.  Urethral meatus is patent.  No penile discharge. No penile lesions or rashes. Scrotum without lesions, cysts, rashes and/or edema.  Testicles are located scrotally bilaterally. No masses are appreciated in the testicles. Left and right epididymis are normal. Rectal: Patient with  normal sphincter tone. Anus and perineum without scarring or rashes. No rectal masses are appreciated. Prostate is approximately 50 grams, no nodules are appreciated. Seminal vesicles are normal. Skin: No rashes, bruises or suspicious lesions. Lymph: No cervical or inguinal adenopathy. Neurologic: Grossly intact, no focal deficits, moving all 4 extremities. Psychiatric: Normal mood and affect.  Laboratory Data: Lab Results  Component Value Date   WBC 2.6 (L) 05/17/2017   HGB 13.6 05/17/2017   HCT 38.8 05/17/2017   MCV 88 05/17/2017   PLT 218 05/17/2017     Lab Results  Component Value Date   CREATININE 0.93 12/06/2016    Lab Results  Component Value Date   AST 21 12/06/2016   Lab Results  Component Value Date   ALT 16 (L) 12/06/2016    Urinalysis    Component Value Date/Time   COLORURINE YELLOW (A) 12/06/2016 1717   APPEARANCEUR CLEAR (A) 12/06/2016 1717   LABSPEC 1.016 12/06/2016 1717   PHURINE 6.0 12/06/2016 1717   GLUCOSEU NEGATIVE 12/06/2016 1717   HGBUR NEGATIVE 12/06/2016 1717   BILIRUBINUR NEGATIVE 12/06/2016 1717   KETONESUR NEGATIVE 12/06/2016 1717   PROTEINUR NEGATIVE 12/06/2016 1717   NITRITE NEGATIVE 12/06/2016 1717   LEUKOCYTESUR NEGATIVE 12/06/2016 1717  I have reviewed the labs.   Pertinent Imaging: Results for Gary GottronROGERS, Gary Nash (MRN [id number]) as of 05/29/2017 22:42  Ref. Range 05/17/2017 15:23  Scan Result Unknown 54ml    Assessment & Plan:    1. History of elevated PSA  - current PSA is 1.46  - RTC in one year for PSA and exam  2. BPH with LUTS  - IPSS score is 11/3  - Continue conservative management, avoiding bladder irritants and timed voiding's  - most bothersome symptoms is/are urgency and nocturia  - RTC in months for IPSS, PSA, PVR and exam   3. Nocturia  - I explained to the patient that nocturia is often multi-factorial and difficult to treat.  Sleeping disorders, heart conditions, peripheral vascular disease, diabetes, an enlarged prostate for men, an urethral stricture causing bladder outlet obstruction and/or certain medications can contribute to nocturia.  - I have suggested that the patient avoid caffeine after noon and alcohol in the evening.  He or she may also benefit from fluid restrictions after 6:00 in the evening and voiding just prior to bedtime.  - I have explained that research studies have showed that over 84% of patients with sleep apnea reported frequent nighttime urination.   With sleep apnea, oxygen decreases, carbon dioxide increases, the blood become more  acidic, the heart rate drops and blood vessels in the lung constrict.  The body is then alerted that something is very wrong. The sleeper must wake enough to reopen the airway. By this time, the heart is racing and experiences a false signal of fluid overload. The heart excretes a hormone-like protein that tells the body to get rid of sodium and water, resulting in nocturia.  -  I also informed the patient that a recent study noted that decreasing sodium intake to 2.3 grams daily, if they don't have issues with hyponatremia, can also reduce the number of nightly voids  - The patient may benefit from a discussion with his or her primary care physician to see if he or she has risk factors for sleep apnea or other sleep disturbances and obtaining a sleep study.   Return for pending testosterone levels.  These notes generated with voice recognition software. I apologize for typographical errors.  Michiel CowboySHANNON Trey Bebee, PA-C  [facility] [address], [address] [place], [place] [address] [phone]

## 2017-05-17 ENCOUNTER — Encounter: Payer: Self-pay | Admitting: Urology

## 2017-05-17 ENCOUNTER — Ambulatory Visit (INDEPENDENT_AMBULATORY_CARE_PROVIDER_SITE_OTHER): Payer: BLUE CROSS/BLUE SHIELD | Admitting: Urology

## 2017-05-17 VITALS — BP 137/90 | HR 74 | Ht 70.0 in | Wt 176.9 lb

## 2017-05-17 DIAGNOSIS — R351 Nocturia: Secondary | ICD-10-CM

## 2017-05-17 DIAGNOSIS — N401 Enlarged prostate with lower urinary tract symptoms: Secondary | ICD-10-CM | POA: Diagnosis not present

## 2017-05-17 DIAGNOSIS — Z87898 Personal history of other specified conditions: Secondary | ICD-10-CM | POA: Diagnosis not present

## 2017-05-17 DIAGNOSIS — N138 Other obstructive and reflux uropathy: Secondary | ICD-10-CM

## 2017-05-17 LAB — BLADDER SCAN AMB NON-IMAGING

## 2017-05-18 LAB — CBC WITH DIFFERENTIAL/PLATELET
BASOS ABS: 0 10*3/uL (ref 0.0–0.2)
BASOS: 0 %
EOS (ABSOLUTE): 0 10*3/uL (ref 0.0–0.4)
Eos: 0 %
HEMATOCRIT: 38.8 % (ref 37.5–51.0)
Hemoglobin: 13.6 g/dL (ref 13.0–17.7)
Immature Grans (Abs): 0 10*3/uL (ref 0.0–0.1)
Immature Granulocytes: 0 %
LYMPHS ABS: 1.4 10*3/uL (ref 0.7–3.1)
Lymphs: 55 %
MCH: 30.8 pg (ref 26.6–33.0)
MCHC: 35.1 g/dL (ref 31.5–35.7)
MCV: 88 fL (ref 79–97)
Monocytes Absolute: 0.3 10*3/uL (ref 0.1–0.9)
Monocytes: 10 %
NEUTROS ABS: 0.9 10*3/uL — AB (ref 1.4–7.0)
Neutrophils: 35 %
PLATELETS: 218 10*3/uL (ref 150–379)
RBC: 4.41 x10E6/uL (ref 4.14–5.80)
RDW: 13.5 % (ref 12.3–15.4)
WBC: 2.6 10*3/uL — ABNORMAL LOW (ref 3.4–10.8)

## 2017-05-18 LAB — TESTOSTERONE: Testosterone: 355 ng/dL (ref 264–916)

## 2017-05-19 ENCOUNTER — Telehealth: Payer: Self-pay

## 2017-05-19 NOTE — Telephone Encounter (Signed)
-----   Message from Harle BattiestShannon A McGowan, PA-C sent at 05/18/2017  7:55 AM EST ----- Please let Mr. Aundria RudRogers know that his testosterone was normal.  His WBC count is lower.  I would speak to his PCP about this and have them look into why this might be so.

## 2017-05-19 NOTE — Telephone Encounter (Signed)
LMOM

## 2017-05-19 NOTE — Telephone Encounter (Signed)
Spoke with patient and gave him his results he was aware that his wbc was low and would follow up with his PCP   Gary Nash Gary Nash

## 2017-05-29 ENCOUNTER — Telehealth: Payer: Self-pay | Admitting: Urology

## 2017-05-29 NOTE — Telephone Encounter (Signed)
Has patient spoken to his PCP regarding his low WBC count and a sleep study?

## 2017-05-30 NOTE — Telephone Encounter (Signed)
LMOM

## 2017-05-30 NOTE — Telephone Encounter (Signed)
If he is not getting up to urinate every night excessively, he probably does not have sleep apnea.

## 2017-05-30 NOTE — Telephone Encounter (Signed)
Spoke to patient, he has not contacted his primary care about the low WBC. He states he will call them tomorrow. He also informed me he miss read the IPSS form and in the last 30 days he gets up maybe 3-4 time in the month not in a night. He does not feel he has Nocturia or is in need of a sleep study. He apologized for the misunderstanding.

## 2017-06-01 ENCOUNTER — Other Ambulatory Visit: Payer: Self-pay

## 2017-06-01 ENCOUNTER — Emergency Department
Admission: EM | Admit: 2017-06-01 | Discharge: 2017-06-01 | Disposition: A | Discharge status: Home / Self Care | Payer: BLUE CROSS/BLUE SHIELD | Attending: Emergency Medicine | Admitting: Emergency Medicine

## 2017-06-01 ENCOUNTER — Encounter: Payer: Self-pay | Admitting: Emergency Medicine

## 2017-06-01 DIAGNOSIS — R079 Chest pain, unspecified: Secondary | ICD-10-CM | POA: Diagnosis not present

## 2017-06-01 DIAGNOSIS — Z79899 Other long term (current) drug therapy: Secondary | ICD-10-CM | POA: Insufficient documentation

## 2017-06-01 LAB — BASIC METABOLIC PANEL
ANION GAP: 8 (ref 5–15)
BUN: 15 mg/dL (ref 6–20)
CO2: 25 mmol/L (ref 22–32)
Calcium: 9 mg/dL (ref 8.9–10.3)
Chloride: 105 mmol/L (ref 101–111)
Creatinine, Ser: 1.04 mg/dL (ref 0.61–1.24)
GFR calc Af Amer: >60 mL/min (ref >60)
Glucose, Bld: 103 mg/dL — ABNORMAL HIGH (ref 65–99)
POTASSIUM: 3.6 mmol/L (ref 3.5–5.1)
SODIUM: 138 mmol/L (ref 135–145)

## 2017-06-01 LAB — CBC WITH DIFFERENTIAL/PLATELET
BASOS ABS: 0 10*3/uL (ref 0–0.1)
Basophils Relative: 0 %
EOS PCT: 2 %
Eosinophils Absolute: 0.1 10*3/uL (ref 0–0.7)
HCT: 40.6 % (ref 40.0–52.0)
Hemoglobin: 13.8 g/dL (ref 13.0–18.0)
LYMPHS PCT: 56 %
Lymphs Abs: 2.1 10*3/uL (ref 1.0–3.6)
MCH: 31.5 pg (ref 26.0–34.0)
MCHC: 33.9 g/dL (ref 32.0–36.0)
MCV: 92.8 fL (ref 80.0–100.0)
MONO ABS: 0.5 10*3/uL (ref 0.2–1.0)
MONOS PCT: 13 %
Neutro Abs: 1.1 10*3/uL — ABNORMAL LOW (ref 1.4–6.5)
Neutrophils Relative %: 29 %
Platelets: 203 10*3/uL (ref 150–440)
RBC: 4.37 MIL/uL — ABNORMAL LOW (ref 4.40–5.90)
RDW: 13 % (ref 11.5–14.5)
WBC: 3.7 10*3/uL — ABNORMAL LOW (ref 3.8–10.6)

## 2017-06-01 LAB — TROPONIN I: Troponin I: <0.03 ng/mL (ref <0.03)

## 2017-06-01 MED ORDER — SODIUM CHLORIDE 0.9 % IV BOLUS (SEPSIS)
500.0000 mL | INTRAVENOUS | Status: AC
  Administered 2017-06-01: 500 mL via INTRAVENOUS

## 2017-06-01 MED ORDER — ASPIRIN 81 MG PO CHEW
324.0000 mg | CHEWABLE_TABLET | Freq: Once | ORAL | Status: AC
  Administered 2017-06-01: 324 mg via ORAL
  Filled 2017-06-01: qty 4

## 2017-06-01 NOTE — Discharge Instructions (Signed)

## 2017-06-01 NOTE — ED Triage Notes (Signed)
Patient coming from home via EMS for not feeling well. Patient states he drank some "Detox around 1:30 and got up to pee around 4:30 and did not feel well. Patient complains of chest pain and numbness to left shoulder. Patient had a normal vitals for EMS.

## 2017-06-01 NOTE — ED Provider Notes (Signed)
[facility] Emergency Department Provider Note  ____________________________________________   First MD Initiated Contact with Patient 06/01/17 [id number]     (approximate)  I have reviewed the triage vital signs and the nursing notes.   HISTORY  Chief Complaint Chest Pain    HPI Gary Nash is a 51 y.o. male who presents by EMS for evaluation of chest pain and general malaise.  He states he has not felt well for several days but is somewhat vague about what that means.  He states that tonight he drank a "detox", but only a small amount, around 1:30 AM and then woke up to go to the bathroom around 4:30 AM felt worse.  He reports some left upper chest pain similar to what he has had in the past (he came to the emergency department about 6 months ago for similar symptoms that occurred after intercourse).  He also reports numbness in his left arm and left shoulder, but when I reviewed the medical record I saw this is been present since February and he confirmed that the symptoms are the same.  He states that he thinks he had some mild shortness of breath initially but that has resolved.  He denies nausea, vomiting, abdominal pain, and dysuria.  Nothing in particular makes his symptoms better nor worse.  He describes them as moderate.  He claims that he followed up with Dr. Kirke Corin as an outpatient had a stress test which was normal.  Past Medical History:  Diagnosis Date  . Benign prostate hyperplasia   . Elevated PSA   . GERD (gastroesophageal reflux disease)     There are no active problems to display for this patient.   Past Surgical History:  Procedure Laterality Date  . NO PAST SURGERIES      Prior to Admission medications   Medication Sig Start Date End Date Taking? Authorizing Provider  Ascorbic Acid (VITAMIN C) 1000 MG tablet Take 1 tablet daily by mouth.    [provider]  cholecalciferol (VITAMIN D) 400 units TABS tablet Take 400 Units  by mouth.    [provider]  Selenium 100 MCG CAPS Take 1 capsule daily by mouth.    [provider]  UNABLE TO FIND Take 1 capsule daily by mouth. Med Name: B-17    [provider]    Allergies Patient has no known allergies.  Family History  Problem Relation Age of Onset  . Erectile dysfunction Father   . Diabetes Father   . Healthy Mother   . Hypertension Brother     Social History Social History   Tobacco Use  . Smoking status: Never Smoker  . Smokeless tobacco: Never Used  Substance Use Topics  . Alcohol use: Yes    Comment: Occasional  . Drug use: No    Review of Systems Constitutional: No fever/chills.  General malaise Eyes: No visual changes. ENT: No sore throat. Cardiovascular: +chest pain. Respiratory: Brief mild shortness of breath. Gastrointestinal: No abdominal pain.  No nausea, no vomiting.  No diarrhea.  No constipation. Genitourinary: Negative for dysuria. Musculoskeletal: Negative for neck pain.  Negative for back pain. Integumentary: Negative for rash. Neurological: Negative for headaches, focal weakness or numbness other than chronic numbness/paresthesia in left upper extremity.   ____________________________________________   PHYSICAL EXAM:  VITAL SIGNS: ED Triage Vitals [06/01/17 0529]  Enc Vitals Group     BP (!) 148/76     Pulse Rate 61     Resp 17  Temp      Temp src      SpO2 100 %     Weight 81.6 kg (180 lb)     Height 1.778 m (5\' 10" )     Head Circumference      Peak Flow      Pain Score 10     Pain Loc      Pain Edu?      Excl. in GC?     Constitutional: Alert and oriented. Well appearing and in no acute distress. Healthy body habitus. Eyes: Conjunctivae are normal.  Head: Atraumatic. Nose: No congestion/rhinnorhea. Mouth/Throat: Mucous membranes are moist. Neck: No stridor.  No meningeal signs.   Cardiovascular: Normal rate, regular rhythm. Good peripheral circulation. Grossly normal  heart sounds. Respiratory: Normal respiratory effort.  No retractions. Lungs CTAB. Gastrointestinal: Soft and nontender. No distention.  Musculoskeletal: No lower extremity tenderness nor edema. No gross deformities of extremities. Neurologic:  Normal speech and language. No gross focal neurologic deficits are appreciated.  Skin:  Skin is warm, dry and intact. No rash noted. Psychiatric: Mood and affect are normal. Speech and behavior are normal.  ____________________________________________   LABS (all labs ordered are listed, but only abnormal results are displayed)  Labs Reviewed  CBC WITH DIFFERENTIAL/PLATELET - Abnormal; Notable for the following components:      Result Value   WBC 3.7 (*)    RBC 4.37 (*)    Neutro Abs 1.1 (*)    All other components within normal limits  BASIC METABOLIC PANEL - Abnormal; Notable for the following components:   Glucose, Bld 103 (*)    All other components within normal limits  TROPONIN I   ____________________________________________  EKG  ED ECG REPORT I, Loleta Roseory Qianna Clagett, the attending physician, personally viewed and interpreted this ECG.  Date: 06/01/2017 EKG Time: 5:40 AM Rate: 57 Rhythm: Borderline sinus bradycardia with sinus arrhythmia QRS Axis: normal Intervals: normal ST/T Wave abnormalities: normal Narrative Interpretation: no evidence of acute ischemia  ____________________________________________  RADIOLOGY   No results found.  ____________________________________________   PROCEDURES  Critical Care performed: No   Procedure(s) performed:   Procedures   ____________________________________________   INITIAL IMPRESSION / ASSESSMENT AND PLAN / ED COURSE  As part of my medical decision making, I reviewed the following data within the electronic MEDICAL RECORD NUMBER Nursing notes reviewed and incorporated, Labs reviewed , EKG interpreted , Old chart reviewed and Notes from prior ED visits    Differential  diagnosis includes, but is not limited to, ACS, aortic dissection, pulmonary embolism, cardiac tamponade, pneumothorax, pneumonia, pericarditis, myocarditis, GI-related causes including esophagitis/gastritis, and musculoskeletal chest wall pain.  HEART score 3 and PERC negative.  Symptoms are similar to symptoms he had previously and he states that he feels "dehydrated" and thinks that the symptoms are the result of him not drinking enough fluids.  He has no clinical signs or symptoms of dehydration but I would give him 500 mL of normal saline while we are obtaining basic labs.  He has had a recent stress test and I reviewed his medical record which has no concerning results from his prior and similar visit.  If his initial troponin and labs in general are reassuring he will be able to be discharged and follow-up with Dr. Octavia Sinkita.  He understands and agrees with the plan.  I will give him a full dose of aspirin.  Clinical Course as of Jun 01 1344  Thu Jun 01, 2017  [id number] Lab reports that troponin  is normal (negative) and electrolytes are within normal limits.  Awaiting the creatinine, but results should be back shortly.  Will update patient and anticipate discharge after results are finalized.  [CF]    Clinical Course User Index [CF] Loleta RoseForbach, Leatha Rohner, MD    ____________________________________________  FINAL CLINICAL IMPRESSION(S) / ED DIAGNOSES  Final diagnoses:  Chest pain, unspecified type     MEDICATIONS GIVEN DURING THIS VISIT:  Medications  sodium chloride 0.9 % bolus 500 mL (0 mLs Intravenous Stopped 06/01/17 0802)  aspirin chewable tablet 324 mg (324 mg Oral Given 06/01/17 0645)     ED Discharge Orders    None       Note:  This document was prepared using Dragon voice recognition software and may include unintentional dictation errors.    Loleta RoseForbach, Jamarien Rodkey, MD 06/01/17 1346

## 2017-06-05 ENCOUNTER — Telehealth: Payer: Self-pay

## 2017-06-05 NOTE — Telephone Encounter (Signed)
Lmov for patient  °They were seen in ED on 06/01/17 for CP  °Has not been seen in office before  °Will try again at a later time °

## 2017-06-08 NOTE — Telephone Encounter (Signed)
Lmov for patient  They were seen in ED on 06/01/17 for CP  Has not been seen in office before  Will try again at a later time

## 2017-06-14 NOTE — Telephone Encounter (Signed)
Sent letter Will await to hear back from patient

## 2017-08-31 ENCOUNTER — Emergency Department
Admission: EM | Admit: 2017-08-31 | Discharge: 2017-08-31 | Disposition: A | Discharge status: Home / Self Care | Payer: BLUE CROSS/BLUE SHIELD | Attending: Emergency Medicine | Admitting: Emergency Medicine

## 2017-08-31 ENCOUNTER — Emergency Department: Payer: BLUE CROSS/BLUE SHIELD

## 2017-08-31 ENCOUNTER — Other Ambulatory Visit: Payer: Self-pay

## 2017-08-31 ENCOUNTER — Encounter: Payer: Self-pay | Admitting: Emergency Medicine

## 2017-08-31 DIAGNOSIS — R0789 Other chest pain: Secondary | ICD-10-CM | POA: Insufficient documentation

## 2017-08-31 DIAGNOSIS — J101 Influenza due to other identified influenza virus with other respiratory manifestations: Secondary | ICD-10-CM

## 2017-08-31 DIAGNOSIS — J111 Influenza due to unidentified influenza virus with other respiratory manifestations: Secondary | ICD-10-CM | POA: Diagnosis not present

## 2017-08-31 DIAGNOSIS — R05 Cough: Secondary | ICD-10-CM | POA: Diagnosis present

## 2017-08-31 LAB — INFLUENZA PANEL BY PCR (TYPE A & B)
Influenza A By PCR: POSITIVE — AB
Influenza B By PCR: NEGATIVE

## 2017-08-31 MED ORDER — OSELTAMIVIR PHOSPHATE 75 MG PO CAPS: 75.0000 mg | ORAL_CAPSULE | Freq: Two times a day (BID) | ORAL | 0 refills | Status: AC

## 2017-08-31 MED ORDER — DEXTROMETHORPHAN POLISTIREX ER 30 MG/5ML PO SUER
30.0000 mg | Freq: Two times a day (BID) | ORAL | Status: DC
  Administered 2017-08-31: 30 mg via ORAL
  Filled 2017-08-31: qty 5

## 2017-08-31 MED ORDER — AZITHROMYCIN 500 MG PO TABS: 500.0000 mg | ORAL_TABLET | Freq: Every day | ORAL | 0 refills | Status: DC

## 2017-08-31 MED ORDER — HYDROCOD POLST-CPM POLST ER 10-8 MG/5ML PO SUER: 5.0000 mL | Freq: Two times a day (BID) | ORAL | 0 refills | Status: DC | PRN

## 2017-08-31 MED ORDER — DM-GUAIFENESIN ER 30-600 MG PO TB12: 1.0000 | ORAL_TABLET | Freq: Two times a day (BID) | ORAL | Status: DC

## 2017-08-31 MED ORDER — OSELTAMIVIR PHOSPHATE 75 MG PO CAPS
75.0000 mg | ORAL_CAPSULE | Freq: Once | ORAL | Status: AC
  Administered 2017-08-31: 75 mg via ORAL
  Filled 2017-08-31: qty 1

## 2017-08-31 MED ORDER — GUAIFENESIN ER 600 MG PO TB12
600.0000 mg | ORAL_TABLET | Freq: Two times a day (BID) | ORAL | Status: DC
  Administered 2017-08-31: 600 mg via ORAL
  Filled 2017-08-31: qty 1

## 2017-08-31 NOTE — ED Triage Notes (Addendum)
Patient to ER for c/o runny nose, cough, and chest congestion for couple days. States he feels like he is now developing congestion in his head.  During triage, patient also reports feeling dizzy and weak, and "like I might pass out" today. Also reports some chest pain.

## 2017-08-31 NOTE — ED Notes (Addendum)
Cold sx x 2-3 days with runny nose, congestion and cough. Pt feels weak like he could just pass out.

## 2017-08-31 NOTE — ED Provider Notes (Signed)
[facility] Emergency Department Provider Note    First MD Initiated Contact with Patient 08/31/17 [phone]     (approximate)  I have reviewed the triage vital signs and the nursing notes.   HISTORY  Chief Complaint Cough   HPI Gary Nash is a 52 y.o. male with below list of chronic medical conditions presents to the emergency department with nonproductive cough, chest discomfort with coughing, nasal congestion generalized body aches times 2 days.  Patient admits to multiple sick contacts positive for the flu.  Past Medical History:  Diagnosis Date  . Benign prostate hyperplasia   . Elevated PSA   . GERD (gastroesophageal reflux disease)     There are no active problems to display for this patient.   Past Surgical History:  Procedure Laterality Date  . NO PAST SURGERIES      Prior to Admission medications   Medication Sig Start Date End Date Taking? Authorizing Provider  Ascorbic Acid (VITAMIN C) 1000 MG tablet Take 1 tablet daily by mouth.    [provider]  azithromycin (ZITHROMAX) 500 MG tablet Take 1 tablet (500 mg total) by mouth daily for 3 days. Take 1 tablet daily for 3 days. 08/31/17 09/03/17  Darci Current, MD  chlorpheniramine-HYDROcodone [facility] PENNKINETIC ER) 10-8 MG/5ML SUER Take 5 mLs by mouth every 12 (twelve) hours as needed. 08/31/17   Darci Current, MD  cholecalciferol (VITAMIN D) 400 units TABS tablet Take 400 Units by mouth.    [provider]  Selenium 100 MCG CAPS Take 1 capsule daily by mouth.    [provider]  UNABLE TO FIND Take 1 capsule daily by mouth. Med Name: B-17    [provider]    Allergies No known drug allergies  Family History  Problem Relation Age of Onset  . Erectile dysfunction Father   . Diabetes Father   . Healthy Mother   . Hypertension Brother     Social History Social History   Tobacco Use  . Smoking status: Never Smoker  . Smokeless  tobacco: Never Used  Substance Use Topics  . Alcohol use: Yes    Comment: Occasional  . Drug use: No    Review of Systems Constitutional: No fever/chills Eyes: No visual changes. ENT: No sore throat.  Positive for nasal congestion Cardiovascular: Denies chest pain. Respiratory: Denies shortness of breath.  Positive for cough Gastrointestinal: No abdominal pain.  No nausea, no vomiting.  No diarrhea.  No constipation. Genitourinary: Negative for dysuria. Musculoskeletal: Negative for neck pain.  Negative for back pain. Integumentary: Negative for rash. Neurological: Negative for headaches, focal weakness or numbness.  ____________________________________________   PHYSICAL EXAM:  VITAL SIGNS: ED Triage Vitals  Enc Vitals Group     BP 08/31/17 0206 (!) 150/87     Pulse Rate 08/31/17 0206 86     Resp 08/31/17 0206 18     Temp 08/31/17 0206 99.1 F (37.3 C)     Temp Source 08/31/17 0206 Oral     SpO2 08/31/17 0206 96 %     Weight 08/31/17 0207 81.6 kg (180 lb)     Height 08/31/17 0207 1.778 m (5\' 10" )     Head Circumference --      Peak Flow --      Pain Score 08/31/17 0210 5     Pain Loc --      Pain Edu? --      Excl. in GC? --  Constitutional: Alert and oriented. Well appearing and in no acute distress. Eyes: Conjunctivae are normal. Head: Atraumatic. Mouth/Throat: Mucous membranes are moist. Oropharynx non-erythematous. Neck: No stridor.  No meningeal signs.  Cardiovascular: Normal rate, regular rhythm. Good peripheral circulation. Grossly normal heart sounds. Respiratory: Normal respiratory effort.  No retractions. Lungs CTAB. Gastrointestinal: Soft and nontender. No distention.  Musculoskeletal: No lower extremity tenderness nor edema. No gross deformities of extremities. Neurologic:  Normal speech and language. No gross focal neurologic deficits are appreciated.  Skin:  Skin is warm, dry and intact. No rash noted. Psychiatric: Mood and affect are normal.  Speech and behavior are normal.  ____________________________________________   LABS (all labs ordered are listed, but only abnormal results are displayed)  Labs Reviewed  INFLUENZA PANEL BY PCR (TYPE A & B) - Abnormal; Notable for the following components:      Result Value   Influenza A By PCR POSITIVE (*)    All other components within normal limits   ____________________________________________  EKG  ED ECG REPORT I, [place] N BROWN, the attending physician, personally viewed and interpreted this ECG.   Date: 08/31/2017  EKG Time: 2:14 AM  Rate: 86  Rhythm: Normal sinus rhythm  Axis: Normal  Intervals: Normal  ST&T Change: None  ____________________________________________  RADIOLOGY I, Sandy N BROWN, personally viewed and evaluated these images (plain radiographs) as part of my medical decision making, as well as reviewing the written report by the radiologist.  ED MD interpretation: No acute cardiopulmonary findings  Official radiology report(s): Dg Chest 2 View  Result Date: 08/31/2017 CLINICAL DATA:  Cough EXAM: CHEST  2 VIEW COMPARISON:  CT chest 12/06/2016, radiograph 09/15/2016 FINDINGS: The heart size and mediastinal contours are within normal limits. Both lungs are clear. Mild degenerative changes of the spine. IMPRESSION: No active cardiopulmonary disease. Electronically Signed   By: Jasmine PangKim  Fujinaga M.D.   On: 08/31/2017 03:07     Procedures   ____________________________________________   INITIAL IMPRESSION / ASSESSMENT AND PLAN / ED COURSE  As part of my medical decision making, I reviewed the following data within the electronic MEDICAL RECORD [id number] year old male present with history and physical exam concerning for influenza less likely pneumonia or bronchitis.  Influenza swab positive for influenza.  Patient given Mucinex in the emergency department and Tamiflu will be prescribed the same for home      ____________________________________________  FINAL CLINICAL IMPRESSION(S) / ED DIAGNOSES  Final diagnoses:  Influenza A     MEDICATIONS GIVEN DURING THIS VISIT:  Medications  guaiFENesin (MUCINEX) 12 hr tablet 600 mg (600 mg Oral Given 08/31/17 0310)    And  dextromethorphan (DELSYM) 30 MG/5ML liquid 30 mg (30 mg Oral Given 08/31/17 0310)  oseltamivir (TAMIFLU) capsule 75 mg (not administered)     ED Discharge Orders        Ordered    chlorpheniramine-HYDROcodone (TUSSIONEX PENNKINETIC ER) 10-8 MG/5ML SUER  Every 12 hours PRN     08/31/17 0334    azithromycin (ZITHROMAX) 500 MG tablet  Daily     08/31/17 0334       Note:  This document was prepared using Dragon voice recognition software and may include unintentional dictation errors.    Darci CurrentBrown,  N, MD 08/31/17 [phone]

## 2018-04-09 ENCOUNTER — Other Ambulatory Visit: Payer: Self-pay

## 2018-04-09 ENCOUNTER — Emergency Department: Payer: BLUE CROSS/BLUE SHIELD

## 2018-04-09 ENCOUNTER — Emergency Department
Admission: EM | Admit: 2018-04-09 | Discharge: 2018-04-09 | Disposition: A | Discharge status: Home / Self Care | Payer: BLUE CROSS/BLUE SHIELD | Attending: Emergency Medicine | Admitting: Emergency Medicine

## 2018-04-09 DIAGNOSIS — M542 Cervicalgia: Secondary | ICD-10-CM | POA: Insufficient documentation

## 2018-04-09 DIAGNOSIS — R202 Paresthesia of skin: Secondary | ICD-10-CM | POA: Insufficient documentation

## 2018-04-09 DIAGNOSIS — R519 Headache, unspecified: Secondary | ICD-10-CM

## 2018-04-09 DIAGNOSIS — Z79899 Other long term (current) drug therapy: Secondary | ICD-10-CM | POA: Insufficient documentation

## 2018-04-09 DIAGNOSIS — R51 Headache: Secondary | ICD-10-CM | POA: Diagnosis present

## 2018-04-09 LAB — CBC WITH DIFFERENTIAL/PLATELET
BASOS PCT: 0 %
Basophils Absolute: 0 10*3/uL (ref 0–0.1)
EOS ABS: 0.1 10*3/uL (ref 0–0.7)
Eosinophils Relative: 2 %
HEMATOCRIT: 40.9 % (ref 40.0–52.0)
HEMOGLOBIN: 14.4 g/dL (ref 13.0–18.0)
LYMPHS ABS: 1.9 10*3/uL (ref 1.0–3.6)
Lymphocytes Relative: 53 %
MCH: 32.4 pg (ref 26.0–34.0)
MCHC: 35.2 g/dL (ref 32.0–36.0)
MCV: 92 fL (ref 80.0–100.0)
MONOS PCT: 12 %
Monocytes Absolute: 0.4 10*3/uL (ref 0.2–1.0)
NEUTROS PCT: 33 %
Neutro Abs: 1.2 10*3/uL — ABNORMAL LOW (ref 1.4–6.5)
Platelets: 182 10*3/uL (ref 150–440)
RBC: 4.44 MIL/uL (ref 4.40–5.90)
RDW: 13.2 % (ref 11.5–14.5)
WBC: 3.5 10*3/uL — AB (ref 3.8–10.6)

## 2018-04-09 LAB — BASIC METABOLIC PANEL
ANION GAP: 10 (ref 5–15)
BUN: 12 mg/dL (ref 6–20)
CHLORIDE: 102 mmol/L (ref 98–111)
CO2: 24 mmol/L (ref 22–32)
CREATININE: 0.85 mg/dL (ref 0.61–1.24)
Calcium: 9.2 mg/dL (ref 8.9–10.3)
GFR calc non Af Amer: >60 mL/min (ref >60)
Glucose, Bld: 87 mg/dL (ref 70–99)
Potassium: 3.7 mmol/L (ref 3.5–5.1)
SODIUM: 136 mmol/L (ref 135–145)

## 2018-04-09 MED ORDER — IOHEXOL 350 MG/ML SOLN
75.0000 mL | Freq: Once | INTRAVENOUS | Status: AC | PRN
  Administered 2018-04-09: 75 mL via INTRAVENOUS

## 2018-04-09 NOTE — Discharge Instructions (Addendum)
Please return here for worsening symptoms.  Please follow-up with your primary care doctor and Dr. Lesleigh Noe orthopedics.  Referral to neurology here in the hospital feels physical therapy may help.

## 2018-04-09 NOTE — ED Triage Notes (Signed)
Pt states about 2 weeks ago a door came down and hit the top of his head, states he had a HA for about 3 days that went away. States the HA came back last night radiating into the shoulders.

## 2018-04-09 NOTE — ED Notes (Signed)
Pt back to room from MRI.

## 2018-04-09 NOTE — ED Notes (Signed)
Pt to MRI at this time.

## 2018-04-09 NOTE — ED Provider Notes (Addendum)
[facility] Emergency Department Provider Note   ____________________________________________   First MD Initiated Contact with Patient 04/09/18 1053     (approximate)  I have reviewed the triage vital signs and the nursing notes.   HISTORY  Chief Complaint Head Injury   HPI Gary Nash is a 52 y.o. male who reports 2 weeks ago he had an overhead door come down and hit him in the head.  He had a headache for about 3 days.  He went to see company doctor had x-rays done but did not show anything.  Everything went away but last night had a came back radiating down the back of his neck and down into the shoulders. His right arm is numb and tingly and his leg feels like it is going to sleep.  He does not think he has any weakness in the leg he is unsure about the arm.  Headache is bad keep him from sleeping.  Is diffuse and achy.   Past Medical History:  Diagnosis Date  . Benign prostate hyperplasia   . Elevated PSA   . GERD (gastroesophageal reflux disease)     There are no active problems to display for this patient.   Past Surgical History:  Procedure Laterality Date  . NO PAST SURGERIES      Prior to Admission medications   Medication Sig Start Date End Date Taking? Authorizing Provider  Ascorbic Acid (VITAMIN C) 1000 MG tablet Take 1 tablet daily by mouth.    [provider]  cholecalciferol (VITAMIN D) 400 units TABS tablet Take 400 Units by mouth.    [provider]  Selenium 100 MCG CAPS Take 1 capsule daily by mouth.    [provider]  UNABLE TO FIND Take 1 capsule daily by mouth. Med Name: B-17    [provider]    Allergies Patient has no known allergies.  Family History  Problem Relation Age of Onset  . Erectile dysfunction Father   . Diabetes Father   . Healthy Mother   . Hypertension Brother     Social History Social History   Tobacco Use  . Smoking status: Never Smoker  . Smokeless  tobacco: Never Used  Substance Use Topics  . Alcohol use: Yes    Comment: Occasional  . Drug use: No    Review of Systems  Constitutional: No fever/chills Eyes: No visual changes. ENT: No sore throat. Cardiovascular: Denies chest pain. Respiratory: Denies shortness of breath. Gastrointestinal: No abdominal pain.  No nausea, no vomiting.  No diarrhea.  No constipation. Genitourinary: Negative for dysuria. Musculoskeletal: Negative for back pain. Skin: Negative for rash. Neurological: See HPI  ____________________________________________   PHYSICAL EXAM:  VITAL SIGNS: ED Triage Vitals  Enc Vitals Group     BP 04/09/18 0856 129/74     Pulse Rate 04/09/18 0856 (!) 56     Resp 04/09/18 0856 16     Temp 04/09/18 0856 98.1 F (36.7 C)     Temp Source 04/09/18 0856 Oral     SpO2 04/09/18 0856 100 %     Weight 04/09/18 0857 175 lb (79.4 kg)     Height 04/09/18 0857 5\' 10"  (1.778 m)     Head Circumference --      Peak Flow --      Pain Score 04/09/18 0856 7     Pain Loc --      Pain Edu? --      Excl. in GC? --  Constitutional: Alert and oriented. Well appearing and in no acute distress. Eyes: Conjunctivae are normal.  Head: Atraumatic. Nose: No congestion/rhinnorhea. Mouth/Throat: Mucous membranes are moist.  Oropharynx non-erythematous. Neck: No stridor.  Cardiovascular: Normal rate, regular rhythm. Grossly normal heart sounds.  Good peripheral circulation. Respiratory: Normal respiratory effort.  No retractions. Lungs CTAB. Gastrointestinal: Soft and nontender. No distention. No abdominal bruits. No CVA tenderness. Musculoskeletal: No lower extremity tenderness nor edema.  Neurologic:  Normal speech and language.  Cranial nerves appear to be within normal limits although visual fields were not checked motor strength is 5/5 throughout I cannot find any evidence of focal weakness.  Patient reports the numbness is described in HPI.. Skin:  Skin is warm, dry and  intact. No rash noted. Psychiatric: Mood and affect are normal. Speech and behavior are normal.  ____________________________________________   LABS (all labs ordered are listed, but only abnormal results are displayed)  Labs Reviewed  CBC WITH DIFFERENTIAL/PLATELET - Abnormal; Notable for the following components:      Result Value   WBC 3.5 (*)    Neutro Abs 1.2 (*)    All other components within normal limits  BASIC METABOLIC PANEL   ____________________________________________  EKG EKG read and interpreted by me shows sinus bradycardia rate of 43 normal axis no acute ST-T wave changes EKG except for the rate is essentially normal.  There is some slightly lower than usual voltage in the chest leads.  ____________________________________________  RADIOLOGY  ED MD interpretation: T angiogram is negative MRI head and neck is negative we will have patient follow-up with orthopedics   Official radiology report(s): Dg Shoulder Right  Result Date: 04/09/2018 CLINICAL DATA:  Pain after recent injury EXAM: RIGHT SHOULDER - 2+ VIEW COMPARISON:  None. FINDINGS: Oblique, Y scapular, and axillary images were obtained. No fracture or dislocation. Joint spaces appear normal. No erosive change or intra-articular calcification. Visualized right lung clear. IMPRESSION: No fracture or dislocation.  No appreciable arthropathy. Electronically Signed   By: Bretta Bang III M.D.   On: 04/09/2018 11:25   Ct Angio Neck W And/or Wo Contrast  Result Date: 04/09/2018 CLINICAL DATA:  52 year old male status post blunt trauma to the top of the head from an overhead door 2 weeks ago. Persistent headache. Subsequent radiation of pain down the back of his neck and to the shoulders with right arm and leg numbness. EXAM: CT ANGIOGRAPHY NECK TECHNIQUE: Multidetector CT imaging of the neck was performed using the standard protocol during bolus administration of intravenous contrast. Multiplanar CT image  reconstructions and MIPs were obtained to evaluate the vascular anatomy. Carotid stenosis measurements (when applicable) are obtained utilizing NASCET criteria, using the distal internal carotid diameter as the denominator. CONTRAST:  75mL OMNIPAQUE IOHEXOL 350 MG/ML SOLN COMPARISON:  Brain and Cervical spine MRI 1327 hours today. Head CT without contrast 09/15/2016. FINDINGS: Skeleton: Visualized skull base is intact. No atlanto-occipital dissociation. Cervicothoracic junction alignment is within normal limits. Bilateral posterior element alignment is within normal limits. No cervical spine fracture identified. Visible upper thoracic levels appear intact. No acute osseous abnormality identified. Visualized paranasal sinuses and mastoids are stable and well pneumatized. Upper chest: Negative. Other neck: Negative. Negative visible posterior fossa. Visualized orbit soft tissues are within normal limits. Aortic arch: Negative aortic arch with 3 vessel arch configuration. No atherosclerosis. No great vessel origin stenosis. Right carotid system: Negative.  Normal visible right ICA siphon. Left carotid system: Negative. Normal visible left ICA siphon aside from mild tortuosity. Vertebral arteries: Normal  proximal right subclavian artery and right vertebral artery origin. Normal right vertebral artery to the vertebrobasilar junction. Normal right PICA origin. Negative visible basilar artery. Normal proximal left subclavian artery and left vertebral artery origin. Normal left vertebral artery to the vertebrobasilar junction. Normal left PICA origin. Review of the MIP images confirms the above findings IMPRESSION: 1. Negative Neck CTA; normal bilateral carotid and vertebral arteries to the visible skull base. 2. No cervical spine fracture or traumatic injury identified in the neck. Electronically Signed   By: Odessa Fleming M.D.   On: 04/09/2018 17:24   Mr Brain Wo Contrast  Result Date: 04/09/2018 CLINICAL DATA:  Trauma to  the top of the head 2 weeks ago. Struck by a door. Persistent headache. EXAM: MRI HEAD WITHOUT CONTRAST TECHNIQUE: Multiplanar, multiecho pulse sequences of the brain and surrounding structures were obtained without intravenous contrast. COMPARISON:  CT 09/15/2016 FINDINGS: Brain: Diffusion imaging does not show any acute or subacute infarction. The brainstem and cerebellum are normal. Cerebral hemispheres are normal except for a single focus of T2 and FLAIR signal in the deep white matter adjacent to the atrium of the left lateral ventricle, not likely significant as an isolated finding. No mass lesion, hemorrhage, hydrocephalus or extra-axial collection. Vascular: Major vessels at the base of the brain show flow. Venous sinuses appear patent. Skull and upper cervical spine: Normal. Sinuses/Orbits: Clear/normal. Other: None significant. IMPRESSION: Negative examination. No traumatic finding. Normal except for a single small focus of T2 and FLAIR signal in the deep white matter of the left hemisphere adjacent to the atrium of the left lateral ventricle, not likely of clinical relevance as an isolated finding. Electronically Signed   By: Paulina Fusi M.D.   On: 04/09/2018 13:53   Mr Cervical Spine Wo Contrast  Result Date: 04/09/2018 CLINICAL DATA:  Struck in the head 2 weeks ago. Persistent and recurring headache. EXAM: MRI CERVICAL SPINE WITHOUT CONTRAST TECHNIQUE: Multiplanar, multisequence MR imaging of the cervical spine was performed. No intravenous contrast was administered. COMPARISON:  None. FINDINGS: Alignment: Normal Vertebrae: Normal Cord: Normal Posterior Fossa, vertebral arteries, paraspinal tissues: Normal Disc levels: No abnormality at the foramen magnum, C1-2 or C2-3. C3-4: Central disc bulge indents the ventral subarachnoid space but does not affect the cord or show foraminal extension. Foraminal narrowing on the right because of uncovertebral osteophytes. C4-5: Right posterolateral spondylosis  with endplate osteophytes and bulging disc material. No compressive effect upon the cord. Right foraminal narrowing. C5-6: Endplate osteophytes and bulging of the disc. No compressive canal or foraminal narrowing. C6-7 and C7-T1: Normal. IMPRESSION: No acute or traumatic finding. Right-sided predominant spondylosis at C3-4 and C4-5. No compressive central canal stenosis. Right foraminal narrowing at those 2 levels that would have some potential to affect the right C4 and C5 nerves. Electronically Signed   By: Paulina Fusi M.D.   On: 04/09/2018 13:56    ____________________________________________   PROCEDURES  Procedure(s) performed:   Procedures  Critical Care performed:   ____________________________________________   INITIAL IMPRESSION / ASSESSMENT AND PLAN / ED COURSE  Headache seems to be the same type of headache he had before.  Because it is new and he has numbness and tingling I will get an MRI of his head and neck.  Want to make sure there is no soft tissue impingement on the cord or any possible brain injury from the head.    ----------------------------------------- 2:33 PM on 04/09/2018 -----------------------------------------  Patient reports he is always having a low heart rate.  ____________________________________________   FINAL CLINICAL IMPRESSION(S) / ED DIAGNOSES  Discussed patient with Dr. Thad Ranger.  She feels that if the MRI is negative does not show any ischemia or anything else the there should be no problems.  She did say she I want her to be 100% sure that we should do a CT Angie of the neck.  I discussed this with the patient and he opted to get the CT angiogram of the neck we will therefore do this.  I anticipate this will also be negative.  If this is true I will have the patient follow-up with Dr. Martha Clan.  May need physical therapy.  Final diagnoses:  Nonintractable episodic headache, unspecified headache type  Also neck pain and right-sided  numbness   ED Discharge Orders    None       Note:  This document was prepared using Dragon voice recognition software and may include unintentional dictation errors.    Arnaldo Natal, MD 04/09/18 [id number]    Arnaldo Natal, MD 04/09/18 [phone]

## 2018-11-30 ENCOUNTER — Emergency Department
Admission: EM | Admit: 2018-11-30 | Discharge: 2018-11-30 | Disposition: A | Discharge status: Home / Self Care | Payer: BLUE CROSS/BLUE SHIELD | Attending: Emergency Medicine | Admitting: Emergency Medicine

## 2018-11-30 ENCOUNTER — Encounter: Payer: Self-pay | Admitting: Emergency Medicine

## 2018-11-30 ENCOUNTER — Emergency Department: Payer: BLUE CROSS/BLUE SHIELD

## 2018-11-30 ENCOUNTER — Other Ambulatory Visit: Payer: Self-pay

## 2018-11-30 DIAGNOSIS — Z20828 Contact with and (suspected) exposure to other viral communicable diseases: Secondary | ICD-10-CM | POA: Insufficient documentation

## 2018-11-30 DIAGNOSIS — J40 Bronchitis, not specified as acute or chronic: Secondary | ICD-10-CM

## 2018-11-30 DIAGNOSIS — R0602 Shortness of breath: Secondary | ICD-10-CM | POA: Diagnosis present

## 2018-11-30 LAB — CBC WITH DIFFERENTIAL/PLATELET
Abs Immature Granulocytes: 0.01 10*3/uL (ref 0.00–0.07)
Basophils Absolute: 0 10*3/uL (ref 0.0–0.1)
Basophils Relative: 0 %
Eosinophils Absolute: 0 10*3/uL (ref 0.0–0.5)
Eosinophils Relative: 0 %
HCT: 38.5 % — ABNORMAL LOW (ref 39.0–52.0)
Hemoglobin: 13.4 g/dL (ref 13.0–17.0)
Immature Granulocytes: 0 %
Lymphocytes Relative: 24 %
Lymphs Abs: 0.8 10*3/uL (ref 0.7–4.0)
MCH: 30.6 pg (ref 26.0–34.0)
MCHC: 34.8 g/dL (ref 30.0–36.0)
MCV: 87.9 fL (ref 80.0–100.0)
Monocytes Absolute: 0.3 10*3/uL (ref 0.1–1.0)
Monocytes Relative: 10 %
Neutro Abs: 2.2 10*3/uL (ref 1.7–7.7)
Neutrophils Relative %: 66 %
Platelets: 203 10*3/uL (ref 150–400)
RBC: 4.38 MIL/uL (ref 4.22–5.81)
RDW: 12.5 % (ref 11.5–15.5)
WBC: 3.3 10*3/uL — ABNORMAL LOW (ref 4.0–10.5)
nRBC: 0 % (ref 0.0–0.2)

## 2018-11-30 LAB — COMPREHENSIVE METABOLIC PANEL
ALT: 24 U/L (ref 0–44)
AST: 21 U/L (ref 15–41)
Albumin: 4.2 g/dL (ref 3.5–5.0)
Alkaline Phosphatase: 45 U/L (ref 38–126)
Anion gap: 7 (ref 5–15)
BUN: 15 mg/dL (ref 6–20)
CO2: 25 mmol/L (ref 22–32)
Calcium: 9.5 mg/dL (ref 8.9–10.3)
Chloride: 104 mmol/L (ref 98–111)
Creatinine, Ser: 0.88 mg/dL (ref 0.61–1.24)
GFR calc Af Amer: >60 mL/min (ref >60)
GFR calc non Af Amer: >60 mL/min (ref >60)
Glucose, Bld: 106 mg/dL — ABNORMAL HIGH (ref 70–99)
Potassium: 4.3 mmol/L (ref 3.5–5.1)
Sodium: 136 mmol/L (ref 135–145)
Total Bilirubin: 0.5 mg/dL (ref 0.3–1.2)
Total Protein: 7.9 g/dL (ref 6.5–8.1)

## 2018-11-30 LAB — SARS CORONAVIRUS 2 BY RT PCR (HOSPITAL ORDER, PERFORMED IN ~~LOC~~ HOSPITAL LAB): SARS Coronavirus 2: NEGATIVE

## 2018-11-30 LAB — TROPONIN I: Troponin I: <0.03 ng/mL (ref <0.03)

## 2018-11-30 MED ORDER — ALBUTEROL SULFATE HFA 108 (90 BASE) MCG/ACT IN AERS: 2.0000 | INHALATION_SPRAY | Freq: Four times a day (QID) | RESPIRATORY_TRACT | 2 refills | Status: DC | PRN

## 2018-11-30 MED ORDER — ONDANSETRON HCL 4 MG/2ML IJ SOLN
4.0000 mg | Freq: Once | INTRAMUSCULAR | Status: AC
  Administered 2018-11-30: 11:00:00 4 mg via INTRAVENOUS
  Filled 2018-11-30: qty 2

## 2018-11-30 MED ORDER — PREDNISONE 20 MG PO TABS: 60.0000 mg | ORAL_TABLET | Freq: Every day | ORAL | 0 refills | Status: AC

## 2018-11-30 NOTE — ED Provider Notes (Signed)
[facility] Emergency Department Provider Note  ____________________________________________  Time seen: Approximately 9:23 AM  I have reviewed the triage vital signs and the nursing notes.   HISTORY  Chief Complaint Shortness of Breath   HPI Gary GottronBobby D Nash is a 53 y.o. male with history of GERD who presents for evaluation of shortness of breath.  Patient reports that his symptoms started yesterday.  Patient is complaining of mild constant shortness of breath, intermittent wheezing, congestion, nausea, and mild sore throat.  No fever, no cough.  Yesterday patient had some chest pain that he describes as a burning sensation in his chest which is consistent with his history of reflux.  No chest pain at this time.  No known exposures to COVID.  Patient works in a factory.  He denies any history of smoking.  Reports having bronchitis in the past which feels like this.  No personal or family history of blood clots, no leg pain or swelling, no hemoptysis  Past Medical History:  Diagnosis Date  . Benign prostate hyperplasia   . Elevated PSA   . GERD (gastroesophageal reflux disease)     There are no active problems to display for this patient.   Past Surgical History:  Procedure Laterality Date  . NO PAST SURGERIES      Prior to Admission medications   Medication Sig Start Date End Date Taking? Authorizing Provider  albuterol (VENTOLIN HFA) 108 (90 Base) MCG/ACT inhaler Inhale 2 puffs into the lungs every 6 (six) hours as needed for wheezing or shortness of breath. 11/30/18   Don PerkingVeronese, [place], MD  Ascorbic Acid (VITAMIN C) 1000 MG tablet Take 1 tablet daily by mouth.    [provider]  cholecalciferol (VITAMIN D) 400 units TABS tablet Take 400 Units by mouth.    [provider]  predniSONE (DELTASONE) 20 MG tablet Take 3 tablets (60 mg total) by mouth daily for 5 days. 11/30/18 12/05/18  Nita SickleVeronese, [place], MD  Selenium 100 MCG CAPS Take 1  capsule daily by mouth.    [provider]  UNABLE TO FIND Take 1 capsule daily by mouth. Med Name: B-17    [provider]    Allergies Patient has no known allergies.  Family History  Problem Relation Age of Onset  . Erectile dysfunction Father   . Diabetes Father   . Healthy Mother   . Hypertension Brother     Social History Social History   Tobacco Use  . Smoking status: Never Smoker  . Smokeless tobacco: Never Used  Substance Use Topics  . Alcohol use: Yes    Comment: Occasional  . Drug use: No    Review of Systems  Constitutional: Negative for fever. Eyes: Negative for visual changes. ENT: + sore throat and congestion Neck: No neck pain  Cardiovascular: Negative for chest pain. Respiratory: + shortness of breath and wheezing Gastrointestinal: Negative for abdominal pain, vomiting or diarrhea. + nausea Genitourinary: Negative for dysuria. Musculoskeletal: Negative for back pain. Skin: Negative for rash. Neurological: Negative for headaches, weakness or numbness. Psych: No SI or HI  ____________________________________________   PHYSICAL EXAM:  VITAL SIGNS: ED Triage Vitals  Enc Vitals Group     BP 11/30/18 0855 (!) 146/77     Pulse Rate 11/30/18 0855 64     Resp 11/30/18 0855 18     Temp 11/30/18 0855 98.1 F (36.7 C)     Temp Source 11/30/18 0855 Oral     SpO2 11/30/18 0855 99 %  Weight 11/30/18 0853 175 lb 0.7 oz (79.4 kg)     Height 11/30/18 0853 5\' 10"  (1.778 m)     Head Circumference --      Peak Flow --      Pain Score 11/30/18 0852 4     Pain Loc --      Pain Edu? --      Excl. in GC? --     Constitutional: Alert and oriented. Well appearing and in no apparent distress. HEENT:      Head: Normocephalic and atraumatic.         Eyes: Conjunctivae are normal. Sclera is non-icteric.       Mouth/Throat: Mucous membranes are moist.       Neck: Supple with no signs of meningismus. Cardiovascular: Regular rate and  rhythm. No murmurs, gallops, or rubs. 2+ symmetrical distal pulses are present in all extremities. No JVD. Respiratory: Normal respiratory effort. Lungs are clear to auscultation bilaterally with diminished air movement bilaterally. No wheezes, crackles, or rhonchi.  Gastrointestinal: Soft, non tender, and non distended with positive bowel sounds. No rebound or guarding. Genitourinary: No CVA tenderness. Musculoskeletal: Nontender with normal range of motion in all extremities. No edema, cyanosis, or erythema of extremities. Neurologic: Normal speech and language. Face is symmetric. Moving all extremities. No gross focal neurologic deficits are appreciated. Skin: Skin is warm, dry and intact. No rash noted. Psychiatric: Mood and affect are normal. Speech and behavior are normal.  ____________________________________________   LABS (all labs ordered are listed, but only abnormal results are displayed)  Labs Reviewed  CBC WITH DIFFERENTIAL/PLATELET - Abnormal; Notable for the following components:      Result Value   WBC 3.3 (*)    HCT 38.5 (*)    All other components within normal limits  COMPREHENSIVE METABOLIC PANEL - Abnormal; Notable for the following components:   Glucose, Bld 106 (*)    All other components within normal limits  SARS CORONAVIRUS 2 (HOSPITAL ORDER, PERFORMED IN [facility])  TROPONIN I   ____________________________________________  EKG  ED ECG REPORT I, Nita Sickle, the attending physician, personally viewed and interpreted this ECG.  Sinus bradycardia, rate of 58, normal intervals, normal axis, no ST elevations or depressions. ____________________________________________  RADIOLOGY  I have personally reviewed the images performed during this visit and I agree with the Radiologist's read.   Interpretation by Radiologist:  Dg Chest Portable 1 View  Result Date: 11/30/2018 CLINICAL DATA:  Shortness of breath EXAM: PORTABLE CHEST 1  VIEW COMPARISON:  08/31/2017 FINDINGS: Normal heart size and mediastinal contours. There is no edema, consolidation, effusion, or pneumothorax. Spondylosis. IMPRESSION: No evidence of active disease. Electronically Signed   By: Marnee Spring M.D.   On: 11/30/2018 09:35      ____________________________________________   PROCEDURES  Procedure(s) performed: None Procedures Critical Care performed:  None ____________________________________________   INITIAL IMPRESSION / ASSESSMENT AND PLAN / ED COURSE   53 y.o. male with history of GERD who presents for evaluation of shortness of breath, wheezing, congestion, sore throat, nausea.  Patient is extremely well-appearing and in no distress, normal work of breathing, normal sats, lungs are clear to auscultation with diminished air movement bilaterally but no wheezing or crackles.  No edema of his extremities.  EKG with no evidence of dysrhythmia or ischemia.  Chest x-ray negative for PNA or edema . Labs pending. Ddx viral illness versus pneumonia versus COVID versus bronchitis.     ._________________________ 12:18 PM on 11/30/2018 -----------------------------------------  Labs showing mild leukopenia which is stable for patient.  Chest x-ray negative for pneumonia.  COVID swab is negative.  Other labs with no acute findings.  Ambulatory sats in 98%.  Will discharge home with prednisone and albuterol for bronchitis.  Recommended follow-up with PCP.  Discussed my return precautions with patient.   As part of my medical decision making, I reviewed the following data within the electronic MEDICAL RECORD NUMBER Nursing notes reviewed and incorporated, Labs reviewed , EKG interpreted , Old chart reviewed, Radiograph reviewed , Notes from prior ED visits and [place] Controlled Substance Database    Pertinent labs & imaging results that were available during my care of the patient were reviewed by me and considered in my medical decision making (see chart for  details).    ____________________________________________   FINAL CLINICAL IMPRESSION(S) / ED DIAGNOSES  Final diagnoses:  Bronchitis      NEW MEDICATIONS STARTED DURING THIS VISIT:  ED Discharge Orders         Ordered    predniSONE (DELTASONE) 20 MG tablet  Daily     11/30/18 1217    albuterol (VENTOLIN HFA) 108 (90 Base) MCG/ACT inhaler  Every 6 hours PRN     11/30/18 1217           Note:  This document was prepared using Dragon voice recognition software and may include unintentional dictation errors.    Don Perking, [place], MD 11/30/18 [phone]

## 2018-11-30 NOTE — ED Notes (Signed)
Pt ambulated unassisted for 4 mins. Starting pulse ox = 99%, ending also 99%. No SOB or distress noted or spoken.

## 2018-11-30 NOTE — ED Triage Notes (Signed)
C/O sob, nausea, sore throat since last night.  Patient is AAOx3.  Skin warm and dry. NAD

## 2019-03-22 ENCOUNTER — Other Ambulatory Visit: Payer: Self-pay

## 2019-03-22 ENCOUNTER — Emergency Department: Payer: BC Managed Care – PPO

## 2019-03-22 ENCOUNTER — Emergency Department
Admission: EM | Admit: 2019-03-22 | Discharge: 2019-03-22 | Disposition: A | Discharge status: Home / Self Care | Payer: BC Managed Care – PPO | Attending: Emergency Medicine | Admitting: Emergency Medicine

## 2019-03-22 ENCOUNTER — Encounter: Payer: Self-pay | Admitting: Intensive Care

## 2019-03-22 DIAGNOSIS — R0789 Other chest pain: Secondary | ICD-10-CM | POA: Diagnosis present

## 2019-03-22 DIAGNOSIS — R51 Headache: Secondary | ICD-10-CM | POA: Insufficient documentation

## 2019-03-22 DIAGNOSIS — M5412 Radiculopathy, cervical region: Secondary | ICD-10-CM | POA: Insufficient documentation

## 2019-03-22 LAB — BASIC METABOLIC PANEL
Anion gap: 5 (ref 5–15)
BUN: 14 mg/dL (ref 6–20)
CO2: 28 mmol/L (ref 22–32)
Calcium: 8.9 mg/dL (ref 8.9–10.3)
Chloride: 105 mmol/L (ref 98–111)
Creatinine, Ser: 0.89 mg/dL (ref 0.61–1.24)
GFR calc Af Amer: >60 mL/min (ref >60)
GFR calc non Af Amer: >60 mL/min (ref >60)
Glucose, Bld: 93 mg/dL (ref 70–99)
Potassium: 4.1 mmol/L (ref 3.5–5.1)
Sodium: 138 mmol/L (ref 135–145)

## 2019-03-22 LAB — CBC
HCT: 39.6 % (ref 39.0–52.0)
Hemoglobin: 13.6 g/dL (ref 13.0–17.0)
MCH: 30.7 pg (ref 26.0–34.0)
MCHC: 34.3 g/dL (ref 30.0–36.0)
MCV: 89.4 fL (ref 80.0–100.0)
Platelets: 185 10*3/uL (ref 150–400)
RBC: 4.43 MIL/uL (ref 4.22–5.81)
RDW: 12.4 % (ref 11.5–15.5)
WBC: 2.7 10*3/uL — ABNORMAL LOW (ref 4.0–10.5)
nRBC: 0 % (ref 0.0–0.2)

## 2019-03-22 LAB — TROPONIN I (HIGH SENSITIVITY)
Troponin I (High Sensitivity): 4 ng/L (ref <18)
Troponin I (High Sensitivity): 4 ng/L (ref <18)

## 2019-03-22 MED ORDER — PREDNISONE 20 MG PO TABS: 40.0000 mg | ORAL_TABLET | Freq: Every day | ORAL | 0 refills | Status: AC

## 2019-03-22 MED ORDER — KETOROLAC TROMETHAMINE 60 MG/2ML IM SOLN
60.0000 mg | Freq: Once | INTRAMUSCULAR | Status: AC
  Administered 2019-03-22: 60 mg via INTRAMUSCULAR
  Filled 2019-03-22: qty 2

## 2019-03-22 MED ORDER — NAPROXEN 375 MG PO TABS: 375.0000 mg | ORAL_TABLET | Freq: Two times a day (BID) | ORAL | 0 refills | Status: AC

## 2019-03-22 MED ORDER — DEXAMETHASONE SODIUM PHOSPHATE 10 MG/ML IJ SOLN
10.0000 mg | Freq: Once | INTRAMUSCULAR | Status: AC
  Administered 2019-03-22: 10 mg via INTRAMUSCULAR
  Filled 2019-03-22: qty 1

## 2019-03-22 NOTE — ED Notes (Signed)
Pt otf for imaging 

## 2019-03-22 NOTE — ED Notes (Signed)
Pt arrives ambulatory from triage in NAD, A&Ox4. Reports pain in left side and rib area that started today with some numbness in left hand. Reports no pain at this time. Headache and generalized weakness. Reports no fever or cough but reports a "tickle in my throat"

## 2019-03-22 NOTE — ED Triage Notes (Signed)
PAtient c/o sharp left sided chest pain that radiates to left arm. Also c/o numbness in left hand along with headache. This all started around 0700am today. A&O x4 in triage
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
## 2019-03-22 NOTE — Discharge Instructions (Signed)
As we discussed, I suspect your left hand tingling is due to a neck injury.  This may be the injury that you sustained at work.  For now, take the steroids and anti-inflammatories as prescribed.  You may need to take antacid still with indigestion while on these.  If your symptoms do not improve, I would recommend following up with a spine specialist for MRI and further work-up.  No heavy lifting greater than 10 pounds for the next week.

## 2019-10-18 ENCOUNTER — Other Ambulatory Visit
Admission: RE | Admit: 2019-10-18 | Discharge: 2019-10-18 | Disposition: A | Discharge status: Home / Self Care | Payer: BC Managed Care – PPO | Source: Ambulatory Visit | Attending: Family Medicine | Admitting: Family Medicine

## 2019-10-18 DIAGNOSIS — R0789 Other chest pain: Secondary | ICD-10-CM | POA: Diagnosis present

## 2019-10-18 DIAGNOSIS — R5383 Other fatigue: Secondary | ICD-10-CM | POA: Diagnosis not present

## 2019-10-18 DIAGNOSIS — R0602 Shortness of breath: Secondary | ICD-10-CM | POA: Diagnosis present

## 2019-10-18 LAB — FIBRIN DERIVATIVES D-DIMER (ARMC ONLY): Fibrin derivatives D-dimer (ARMC): 563.17 ng/mL (FEU) — ABNORMAL HIGH (ref 0.00–499.00)
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
## 2019-11-17 ENCOUNTER — Other Ambulatory Visit: Payer: Self-pay

## 2019-11-17 ENCOUNTER — Emergency Department
Admission: EM | Admit: 2019-11-17 | Discharge: 2019-11-17 | Disposition: A | Discharge status: Home / Self Care | Payer: BC Managed Care – PPO | Attending: Emergency Medicine | Admitting: Emergency Medicine

## 2019-11-17 ENCOUNTER — Encounter: Payer: Self-pay | Admitting: Emergency Medicine

## 2019-11-17 DIAGNOSIS — M5431 Sciatica, right side: Secondary | ICD-10-CM

## 2019-11-17 DIAGNOSIS — Z79899 Other long term (current) drug therapy: Secondary | ICD-10-CM | POA: Insufficient documentation

## 2019-11-17 MED ORDER — HYDROCODONE-ACETAMINOPHEN 5-325 MG PO TABS
1.0000 | ORAL_TABLET | Freq: Once | ORAL | Status: AC
  Administered 2019-11-17: 21:00:00 1 via ORAL
  Filled 2019-11-17: qty 1

## 2019-11-17 MED ORDER — MELOXICAM 15 MG PO TABS: 15.0000 mg | ORAL_TABLET | Freq: Every day | ORAL | 0 refills | Status: DC

## 2019-11-17 MED ORDER — METHOCARBAMOL 500 MG PO TABS: 500.0000 mg | ORAL_TABLET | Freq: Four times a day (QID) | ORAL | 0 refills | Status: DC

## 2019-11-17 MED ORDER — ORPHENADRINE CITRATE 30 MG/ML IJ SOLN
60.0000 mg | Freq: Once | INTRAMUSCULAR | Status: AC
  Administered 2019-11-17: 60 mg via INTRAMUSCULAR
  Filled 2019-11-17: qty 2

## 2019-11-17 MED ORDER — KETOROLAC TROMETHAMINE 30 MG/ML IJ SOLN
30.0000 mg | Freq: Once | INTRAMUSCULAR | Status: AC
  Administered 2019-11-17: 21:00:00 30 mg via INTRAMUSCULAR
  Filled 2019-11-17: qty 1

## 2019-11-17 NOTE — ED Provider Notes (Signed)
[facility] Emergency Department Provider Note  ____________________________________________  Time seen: Approximately 7:57 PM  I have reviewed the triage vital signs and the nursing notes.   HISTORY  Chief Complaint Back Pain    HPI Gary Nash is a 54 y.o. male who presents the emergency department complaining of right lower back pain that radiates into the buttocks.  Patient states that he had a previous back injury a year ago.  He was supposed to follow-up with apparently neurosurgery but never did.  Patient was cleaning 2 days ago and has been having back pain since.  No acute injury.  No bowel or bladder dysfunction, saddle anesthesia or paresthesias.  No medications at home prior to arrival.         Past Medical History:  Diagnosis Date  . Benign prostate hyperplasia   . Elevated PSA   . GERD (gastroesophageal reflux disease)     There are no problems to display for this patient.   Past Surgical History:  Procedure Laterality Date  . NO PAST SURGERIES      Prior to Admission medications   Medication Sig Start Date End Date Taking? Authorizing Provider  albuterol (VENTOLIN HFA) 108 (90 Base) MCG/ACT inhaler Inhale 2 puffs into the lungs every 6 (six) hours as needed for wheezing or shortness of breath. 11/30/18   Alfred Levins, [place], MD  Ascorbic Acid (VITAMIN C) 1000 MG tablet Take 1 tablet daily by mouth.    [provider]  cholecalciferol (VITAMIN D) 400 units TABS tablet Take 400 Units by mouth.    [provider]  meloxicam (MOBIC) 15 MG tablet Take 1 tablet (15 mg total) by mouth daily. 11/17/19   Marisel Tostenson, Charline Bills, PA-C  methocarbamol (ROBAXIN) 500 MG tablet Take 1 tablet (500 mg total) by mouth 4 (four) times daily. 11/17/19   Keltin Baird, Charline Bills, PA-C  Selenium 100 MCG CAPS Take 1 capsule daily by mouth.    [provider]  UNABLE TO FIND Take 1 capsule daily by mouth. Med Name: B-17    [provider]    Allergies Patient has no known allergies.  Family History  Problem Relation Age of Onset  . Erectile dysfunction Father   . Diabetes Father   . Healthy Mother   . Hypertension Brother     Social History Social History   Tobacco Use  . Smoking status: Never Smoker  . Smokeless tobacco: Never Used  Substance Use Topics  . Alcohol use: Yes    Comment: Occasional  . Drug use: No     Review of Systems  Constitutional: No fever/chills Eyes: No visual changes. No discharge ENT: No upper respiratory complaints. Cardiovascular: no chest pain. Respiratory: no cough. No SOB. Gastrointestinal: No abdominal pain.  No nausea, no vomiting.  No diarrhea.  No constipation. Genitourinary: Negative for dysuria. No hematuria Musculoskeletal: Positive for back pain radiating into the buttocks Skin: Negative for rash, abrasions, lacerations, ecchymosis. Neurological: Negative for headaches, focal weakness or numbness. 10-point ROS otherwise negative.  ____________________________________________   PHYSICAL EXAM:  VITAL SIGNS: ED Triage Vitals [11/17/19 1710]  Enc Vitals Group     BP 138/73     Pulse Rate 61     Resp 18     Temp 98 F (36.7 C)     Temp src      SpO2 94 %     Weight 175 lb (79.4 kg)     Height 5\' 10"  (1.778 m)  Head Circumference      Peak Flow      Pain Score 10     Pain Loc      Pain Edu?      Excl. in GC?      Constitutional: Alert and oriented. Well appearing and in no acute distress. Eyes: Conjunctivae are normal. PERRL. EOMI. Head: Atraumatic. ENT:      Ears:       Nose: No congestion/rhinnorhea.      Mouth/Throat: Mucous membranes are moist.  Neck: No stridor.    Cardiovascular: Normal rate, regular rhythm. Normal S1 and S2.  Good peripheral circulation. Respiratory: Normal respiratory effort without tachypnea or retractions. Lungs CTAB. Good air entry to the bases with no decreased or absent breath  sounds. Gastrointestinal: Bowel sounds 4 quadrants. Soft and nontender to palpation. No guarding or rigidity. No palpable masses. No distention. No CVA tenderness. Musculoskeletal: Full range of motion to all extremities. No gross deformities appreciated.  No visible signs of trauma to the lumbar spine.  No midline or left-sided tenderness to palpation.  Mild right-sided tenderness extending from the right paraspinal muscle group through the SI joint into the sciatic notch.  No palpable abnormality or step-off along the lumbar spine.  Negative straight leg raise bilaterally.  Dorsalis pedis pulse and sensation intact bilateral lower extremity. Neurologic:  Normal speech and language. No gross focal neurologic deficits are appreciated.  Skin:  Skin is warm, dry and intact. No rash noted. Psychiatric: Mood and affect are normal. Speech and behavior are normal. Patient exhibits appropriate insight and judgement.   ____________________________________________   LABS (all labs ordered are listed, but only abnormal results are displayed)  Labs Reviewed - No data to display ____________________________________________  EKG   ____________________________________________  RADIOLOGY   No results found.  ____________________________________________    PROCEDURES  Procedure(s) performed:    Procedures    Medications  ketorolac (TORADOL) 30 MG/ML injection 30 mg (has no administration in time range)  orphenadrine (NORFLEX) injection 60 mg (has no administration in time range)  HYDROcodone-acetaminophen (NORCO/VICODIN) 5-325 MG per tablet 1 tablet (has no administration in time range)     ____________________________________________   INITIAL IMPRESSION / ASSESSMENT AND PLAN / ED COURSE  Pertinent labs & imaging results that were available during my care of the patient were reviewed by me and considered in my medical decision making (see chart for details).  Review of the [place]  CSRS was performed in accordance of the [facility] prior to dispensing any controlled drugs.           Patient's diagnosis is consistent with sciatica.  Patient presented to emergency department with right-sided back pain.  Patient had a remote history of trauma.  No recent trauma.  Patient was complaining of pain radiating from the back into the buttocks region.  No neuro deficits.  Differential included lumbar strain, bulging disc, sciatica, nephrolithiasis.  No indication for imaging at this time.  Patient will be treated with anti-inflammatory, muscle relaxer, pain medication here in the emergency department.. Patient will be discharged home with prescriptions for meloxicam and Robaxin. Patient is to follow up with primary care or neurosurgery as needed or otherwise directed. Patient is given ED precautions to return to the ED for any worsening or new symptoms.     ____________________________________________  FINAL CLINICAL IMPRESSION(S) / ED DIAGNOSES  Final diagnoses:  Sciatica of right side      NEW MEDICATIONS STARTED DURING THIS VISIT:  ED Discharge Orders  Ordered    meloxicam (MOBIC) 15 MG tablet  Daily     11/17/19 2016    methocarbamol (ROBAXIN) 500 MG tablet  4 times daily     11/17/19 2016              This chart was dictated using voice recognition software/Dragon. Despite best efforts to proofread, errors can occur which can change the meaning. Any change was purely unintentional.    Racheal Patches, PA-C 11/17/19 2017    Minna Antis, MD 11/17/19 2126

## 2019-11-17 NOTE — ED Notes (Signed)
Pt states he was cleaning and his lower back started to hurt and hasn't stopped. Pt states he had a back injury a year ago.

## 2019-11-17 NOTE — ED Triage Notes (Signed)
Pt to ER with c/o lower back pain starting Friday after work.  Pt with hx of previous injury.  Pt increases with movement.

## 2020-07-04 IMAGING — DX PORTABLE CHEST - 1 VIEW
1 series · 1 of 1 positions shown · non-contrast
Comparison: 08/31/2017

CLINICAL DATA: Shortness of breath

EXAM:
PORTABLE CHEST 1 VIEW

[chest ap]
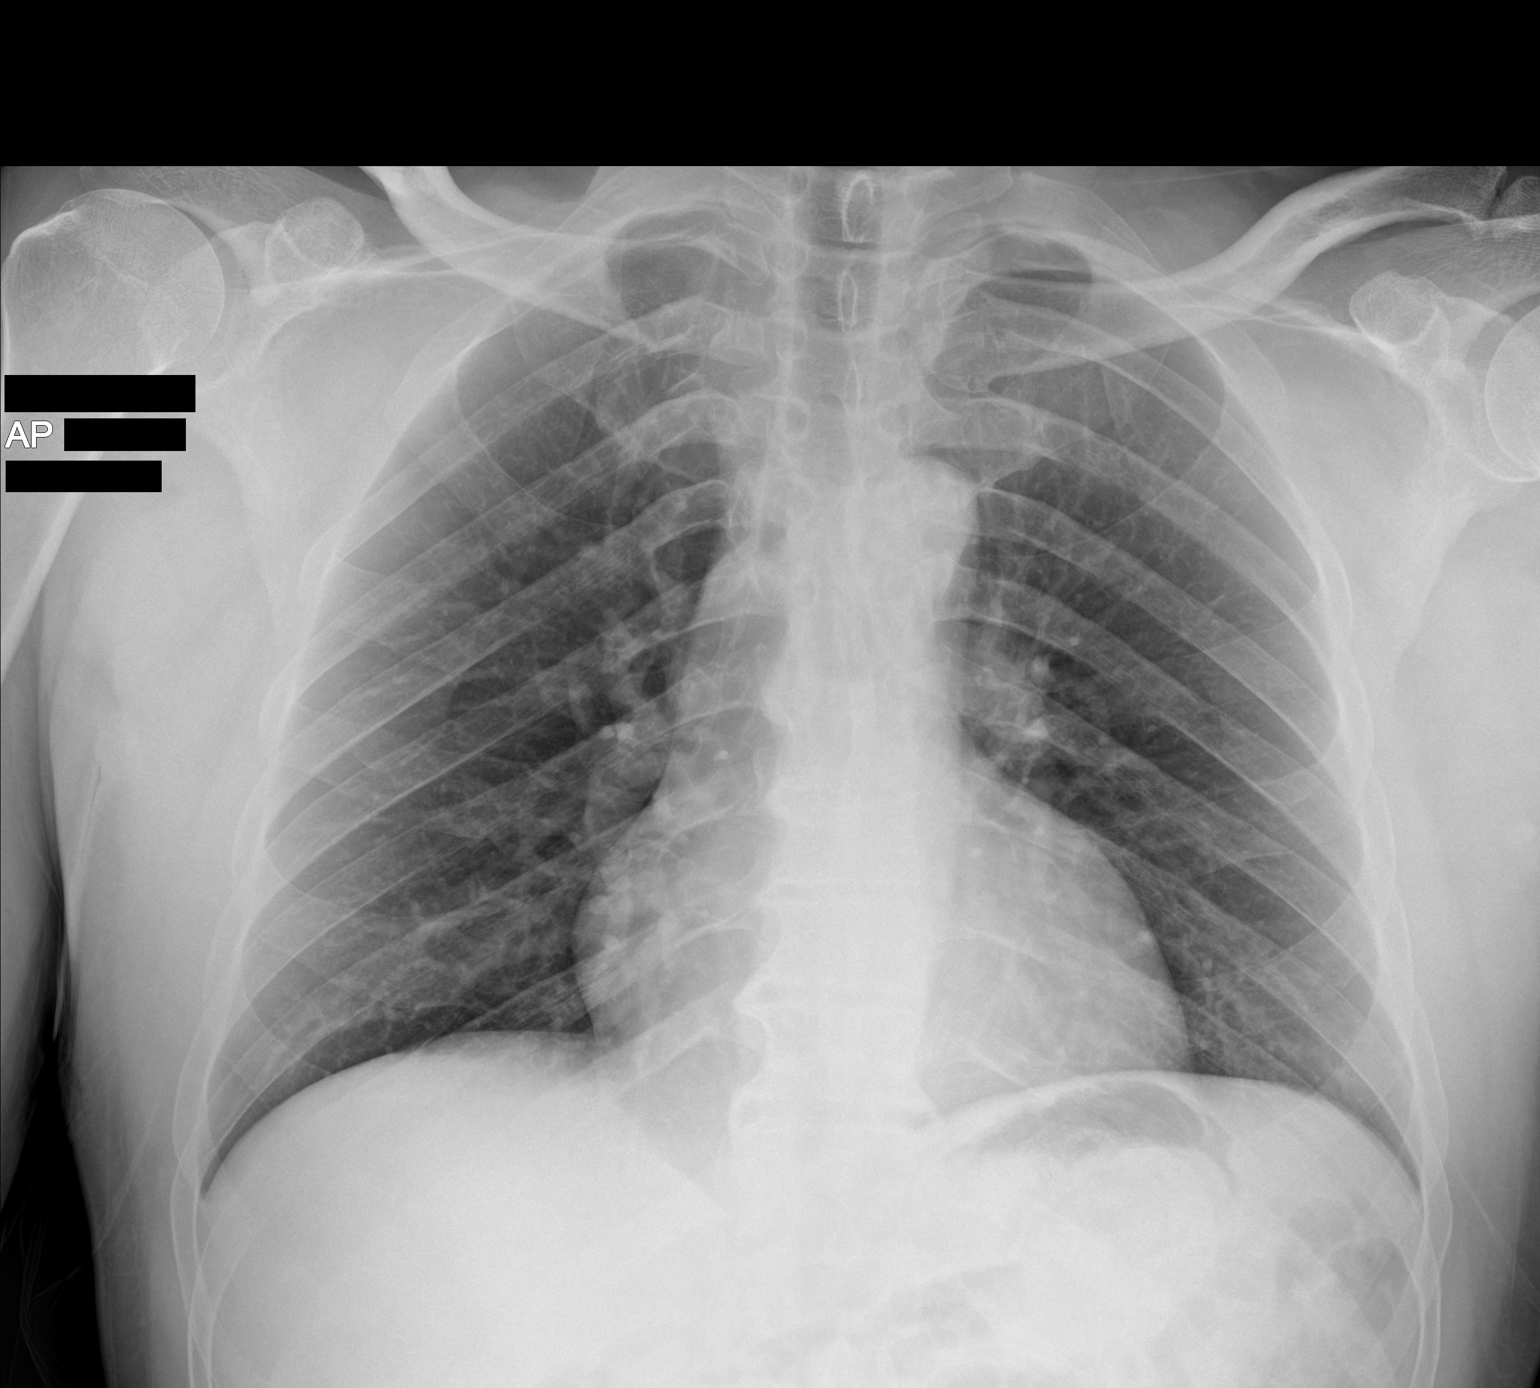

[1 of 1 positions shown; findings below may reference images not displayed]

FINDINGS: Normal heart size and mediastinal contours. There is no edema,
consolidation, effusion, or pneumothorax. Spondylosis.
IMPRESSION: No evidence of active disease.

## 2020-07-11 HISTORY — PX: WISDOM TOOTH EXTRACTION: SHX21

## 2020-07-31 ENCOUNTER — Encounter: Payer: Self-pay | Admitting: Emergency Medicine

## 2020-07-31 ENCOUNTER — Other Ambulatory Visit: Payer: Self-pay

## 2020-07-31 ENCOUNTER — Emergency Department
Admission: EM | Admit: 2020-07-31 | Discharge: 2020-07-31 | Disposition: A | Discharge status: Home / Self Care | Payer: BC Managed Care – PPO | Attending: Emergency Medicine | Admitting: Emergency Medicine

## 2020-07-31 ENCOUNTER — Emergency Department: Payer: BC Managed Care – PPO

## 2020-07-31 DIAGNOSIS — R519 Headache, unspecified: Secondary | ICD-10-CM | POA: Diagnosis not present

## 2020-07-31 DIAGNOSIS — R5381 Other malaise: Secondary | ICD-10-CM | POA: Insufficient documentation

## 2020-07-31 DIAGNOSIS — Z20822 Contact with and (suspected) exposure to covid-19: Secondary | ICD-10-CM | POA: Insufficient documentation

## 2020-07-31 DIAGNOSIS — H9203 Otalgia, bilateral: Secondary | ICD-10-CM | POA: Diagnosis not present

## 2020-07-31 DIAGNOSIS — J45909 Unspecified asthma, uncomplicated: Secondary | ICD-10-CM | POA: Diagnosis not present

## 2020-07-31 DIAGNOSIS — R0602 Shortness of breath: Secondary | ICD-10-CM | POA: Diagnosis not present

## 2020-07-31 DIAGNOSIS — R0789 Other chest pain: Secondary | ICD-10-CM | POA: Insufficient documentation

## 2020-07-31 HISTORY — DX: Unspecified asthma, uncomplicated: J45.909

## 2020-07-31 NOTE — ED Notes (Signed)
EKG given to MD for review

## 2020-07-31 NOTE — Discharge Instructions (Signed)
Your COVID-19 results will post to your MyChart within 24 hours.

## 2020-07-31 NOTE — ED Provider Notes (Signed)
[facility]-EMERGENCY DEPARTMENT  ____________________________________________  Time seen: Approximately 6:08 PM  I have reviewed the triage vital signs and the nursing notes.   HISTORY  Chief Complaint Asthma   Historian Patient     HPI Gary Nash is a 55 y.o. male presents to the emergency department with some chest tightness, shortness of breath, headache and generalized malaise that started last night.  Patient states that he recently recovered from bronchitis and states that he was tested for COVID-19 when he initially became symptomatic and tested negative.  He reports that he took tapered prednisone which seemed to initially improve his symptoms but he states that he now feels worse.  No emesis or diarrhea.  Patient states that he did go to work today.  Patient has numerous potential sick contacts.  No other alleviating measures have been attempted.   Past Medical History:  Diagnosis Date   Asthma    Benign prostate hyperplasia    Elevated PSA    GERD (gastroesophageal reflux disease)      Immunizations up to date:  Yes.     Past Medical History:  Diagnosis Date   Asthma    Benign prostate hyperplasia    Elevated PSA    GERD (gastroesophageal reflux disease)     There are no problems to display for this patient.   Past Surgical History:  Procedure Laterality Date   NO PAST SURGERIES      Prior to Admission medications   Medication Sig Start Date End Date Taking? Authorizing Provider  albuterol (VENTOLIN HFA) 108 (90 Base) MCG/ACT inhaler Inhale 2 puffs into the lungs every 6 (six) hours as needed for wheezing or shortness of breath. 11/30/18   Don Perking, [place], MD  Ascorbic Acid (VITAMIN C) 1000 MG tablet Take 1 tablet daily by mouth.    [provider]  cholecalciferol (VITAMIN D) 400 units TABS tablet Take 400 Units by mouth.    [provider]  meloxicam (MOBIC) 15 MG tablet Take 1 tablet (15 mg total) by mouth daily.  11/17/19   Cuthriell, Delorise Royals, PA-C  methocarbamol (ROBAXIN) 500 MG tablet Take 1 tablet (500 mg total) by mouth 4 (four) times daily. 11/17/19   Cuthriell, Delorise Royals, PA-C  Selenium 100 MCG CAPS Take 1 capsule daily by mouth.    [provider]  UNABLE TO FIND Take 1 capsule daily by mouth. Med Name: B-17    [provider]    Allergies Patient has no known allergies.  Family History  Problem Relation Age of Onset   Erectile dysfunction Father    Diabetes Father    Healthy Mother    Hypertension Brother     Social History Social History   Tobacco Use   Smoking status: Never Smoker   Smokeless tobacco: Never Used  Building services engineer Use: Never used  Substance Use Topics   Alcohol use: Yes    Comment: Occasional   Drug use: No     Review of Systems  Constitutional: No fever/chills Eyes:  No discharge ENT: No upper respiratory complaints. Respiratory: Patient has cough.  Gastrointestinal:   No nausea, no vomiting.  No diarrhea.  No constipation. Musculoskeletal: Negative for musculoskeletal pain. Skin: Negative for rash, abrasions, lacerations, ecchymosis.    ____________________________________________   PHYSICAL EXAM:  VITAL SIGNS: ED Triage Vitals  Enc Vitals Group     BP 07/31/20 1612 129/69     Pulse Rate 07/31/20 1612 65     Resp 07/31/20 1612  16     Temp 07/31/20 1612 98.7 F (37.1 C)     Temp Source 07/31/20 1612 Oral     SpO2 07/31/20 1612 98 %     Weight 07/31/20 1626 185 lb (83.9 kg)     Height 07/31/20 1626 5\' 10"  (1.778 m)     Head Circumference --      Peak Flow --      Pain Score 07/31/20 1626 0     Pain Loc --      Pain Edu? --      Excl. in GC? --      Constitutional: Alert and oriented. Patient is lying supine. Eyes: Conjunctivae are normal. PERRL. EOMI. Head: Atraumatic. ENT:      Ears: Tympanic membranes are mildly injected with mild effusion bilaterally.       Nose: No congestion/rhinnorhea.       Mouth/Throat: Mucous membranes are moist. Posterior pharynx is mildly erythematous.  Hematological/Lymphatic/Immunilogical: No cervical lymphadenopathy.  Cardiovascular: Normal rate, regular rhythm. Normal S1 and S2.  Good peripheral circulation. Respiratory: Normal respiratory effort without tachypnea or retractions. Lungs CTAB. Good air entry to the bases with no decreased or absent breath sounds. Gastrointestinal: Bowel sounds 4 quadrants. Soft and nontender to palpation. No guarding or rigidity. No palpable masses. No distention. No CVA tenderness. Musculoskeletal: Full range of motion to all extremities. No gross deformities appreciated. Neurologic:  Normal speech and language. No gross focal neurologic deficits are appreciated.  Skin:  Skin is warm, dry and intact. No rash noted. Psychiatric: Mood and affect are normal. Speech and behavior are normal. Patient exhibits appropriate insight and judgement.    ____________________________________________   LABS (all labs ordered are listed, but only abnormal results are displayed)  Labs Reviewed  SARS CORONAVIRUS 2 (TAT 6-24 HRS)   ____________________________________________  EKG   ____________________________________________  RADIOLOGY 08/02/20, personally viewed and evaluated these images (plain radiographs) as part of my medical decision making, as well as reviewing the written report by the radiologist.    DG Chest 1 View  Result Date: 07/31/2020 CLINICAL DATA:  Asthma, bronchitis, cough EXAM: CHEST  1 VIEW COMPARISON:  03/22/2019 FINDINGS: The heart size and mediastinal contours are within normal limits. Both lungs are clear. The visualized skeletal structures are unremarkable. IMPRESSION: No active disease. Electronically Signed   By: 05/22/2019 M.D.   On: 07/31/2020 18:25    ____________________________________________    PROCEDURES  Procedure(s) performed:     Procedures     Medications - No  data to display   ____________________________________________   INITIAL IMPRESSION / ASSESSMENT AND PLAN / ED COURSE  Pertinent labs & imaging results that were available during my care of the patient were reviewed by me and considered in my medical decision making (see chart for details).      Assessment and Plan:  Headache Malaise Chest tightness 55 year old male presents to the emergency department with headache, some chest tightness and malaise.  Vital signs are reassuring at triage.  On physical exam, patient was alert, active and nontoxic appearing with no adventitious lung sounds auscultated.  EKG indicated sinus bradycardia but no other apparent arrhythmia.  Chest x-ray showed no consolidations, opacities or infiltrates.  Send off COVID-19 testing is in process at this time.  Rest and hydration were encouraged at home and a work note was provided.      ____________________________________________  FINAL CLINICAL IMPRESSION(S) / ED DIAGNOSES  Final diagnoses:  Acute nonintractable headache, unspecified headache  type  Malaise      NEW MEDICATIONS STARTED DURING THIS VISIT:  ED Discharge Orders    None          This chart was dictated using voice recognition software/Dragon. Despite best efforts to proofread, errors can occur which can change the meaning. Any change was purely unintentional.     Gasper Lloyd 07/31/20 1849    Jene Every, MD 07/31/20 2101

## 2020-07-31 NOTE — ED Triage Notes (Signed)
Pt to ED via POV stating that he has hx/o of Asthma, pt states that he recently got over Bronchitis. Pt states that he was using some chemicals at home to clean and it flared his asthma up. Pt states that his breathing feels tight. Pt is in NAD at this time. Pt is able to speak in complete sentences at this time.

## 2020-07-31 NOTE — ED Notes (Signed)
No e-signature obtained d/t no e-sig pad working.  Pt verbalized understanding of all discharge instructions and f/u care.

## 2020-07-31 NOTE — ED Notes (Signed)
Pt states a history of asthma with reactions to chemicals. Pt stated he started to have some chest pain today at home and decided to come in.

## 2020-08-01 LAB — SARS CORONAVIRUS 2 (TAT 6-24 HRS): SARS Coronavirus 2: NEGATIVE

## 2020-08-03 ENCOUNTER — Telehealth: Payer: Self-pay

## 2020-08-03 NOTE — Telephone Encounter (Signed)
Pt aware covid lab test negative, not detected °

## 2020-10-24 IMAGING — CR DG CHEST 2V
1 series · 2 of 2 positions shown · non-contrast
Comparison: 11/30/2007

CLINICAL DATA: Sharp LEFT side chest pain radiating to LEFT arm,
numbness and tingling LEFT hand, headache, onset of symptoms 0600
hours today

EXAM:
CHEST - 2 VIEW

[Series 1: dg chest 2 view · 0.14mm/px · 2 of 2 slices shown]
[im 1/2]
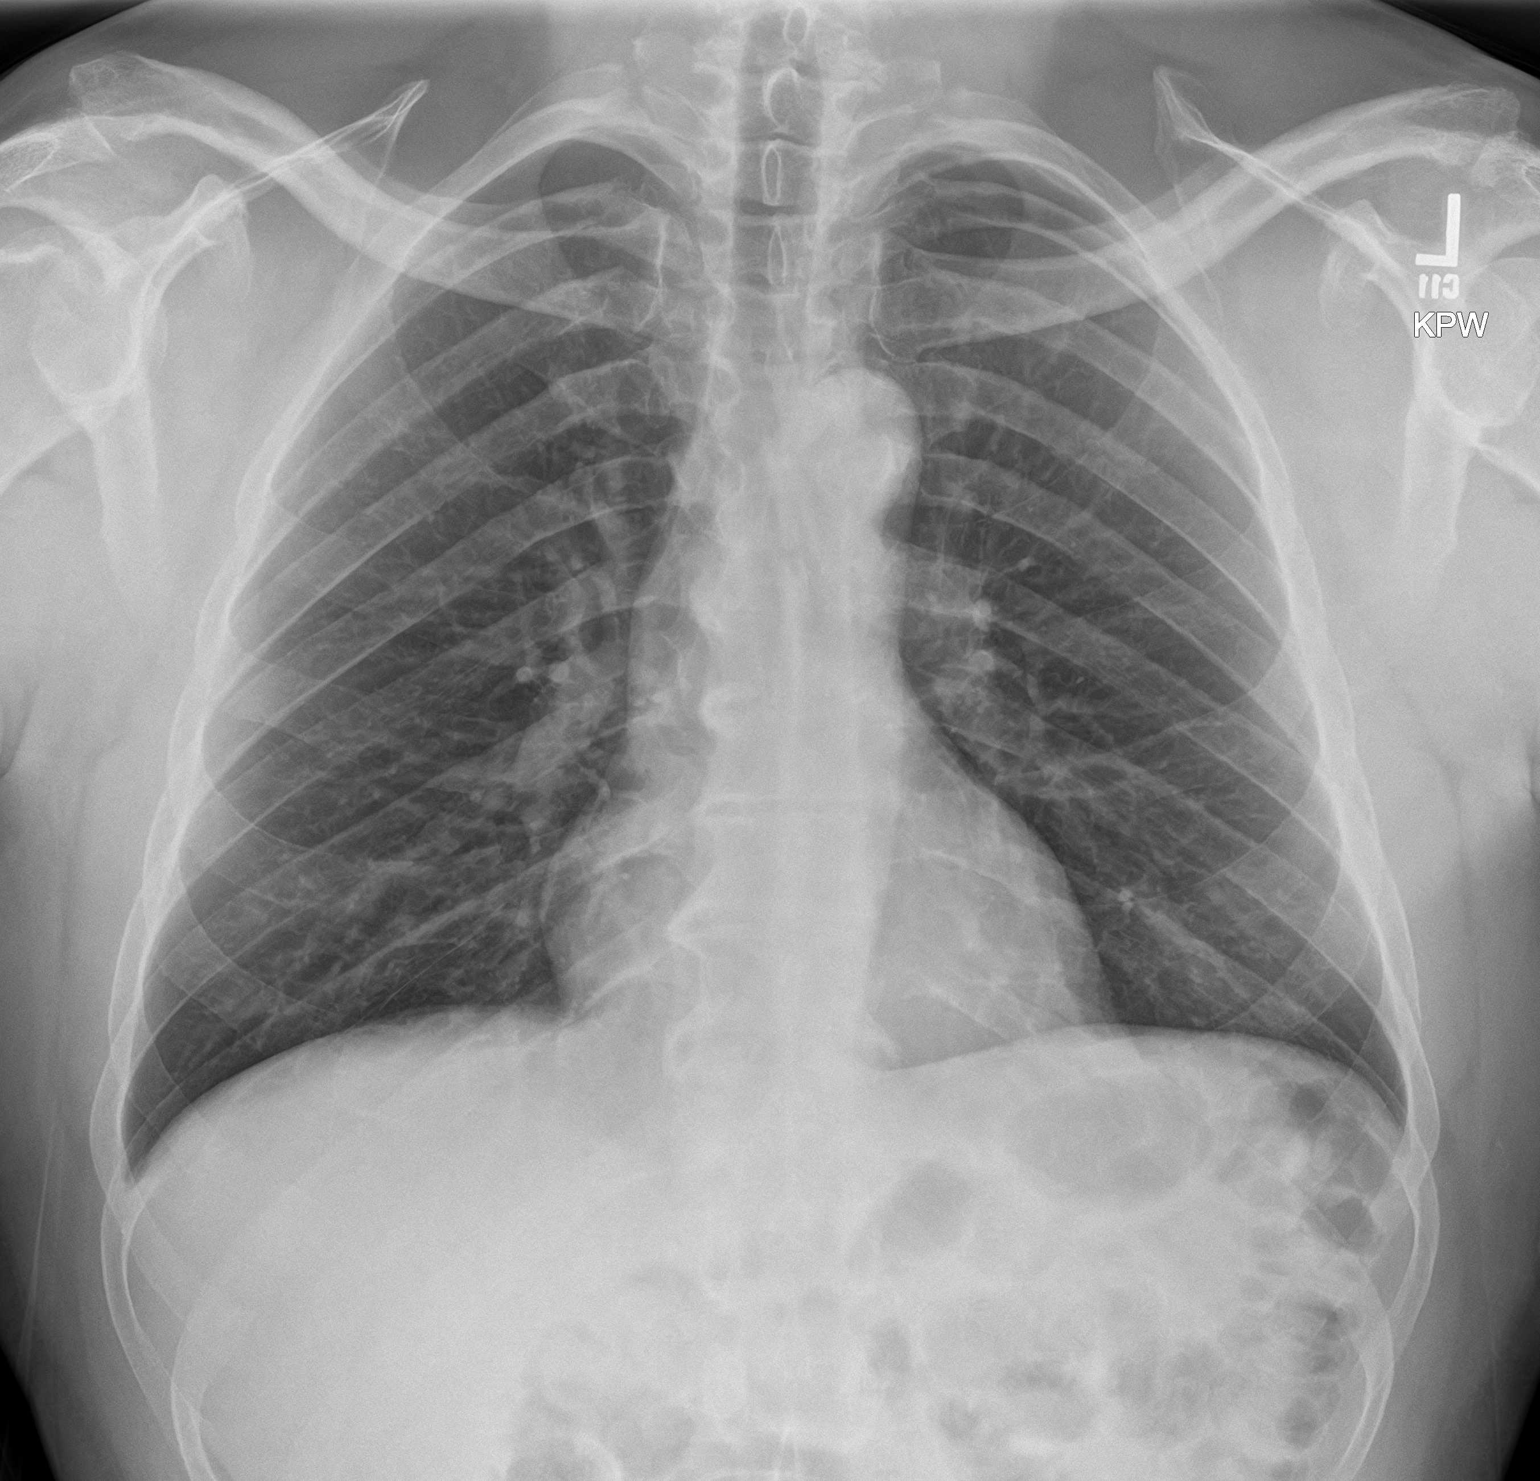
[im 2/2]
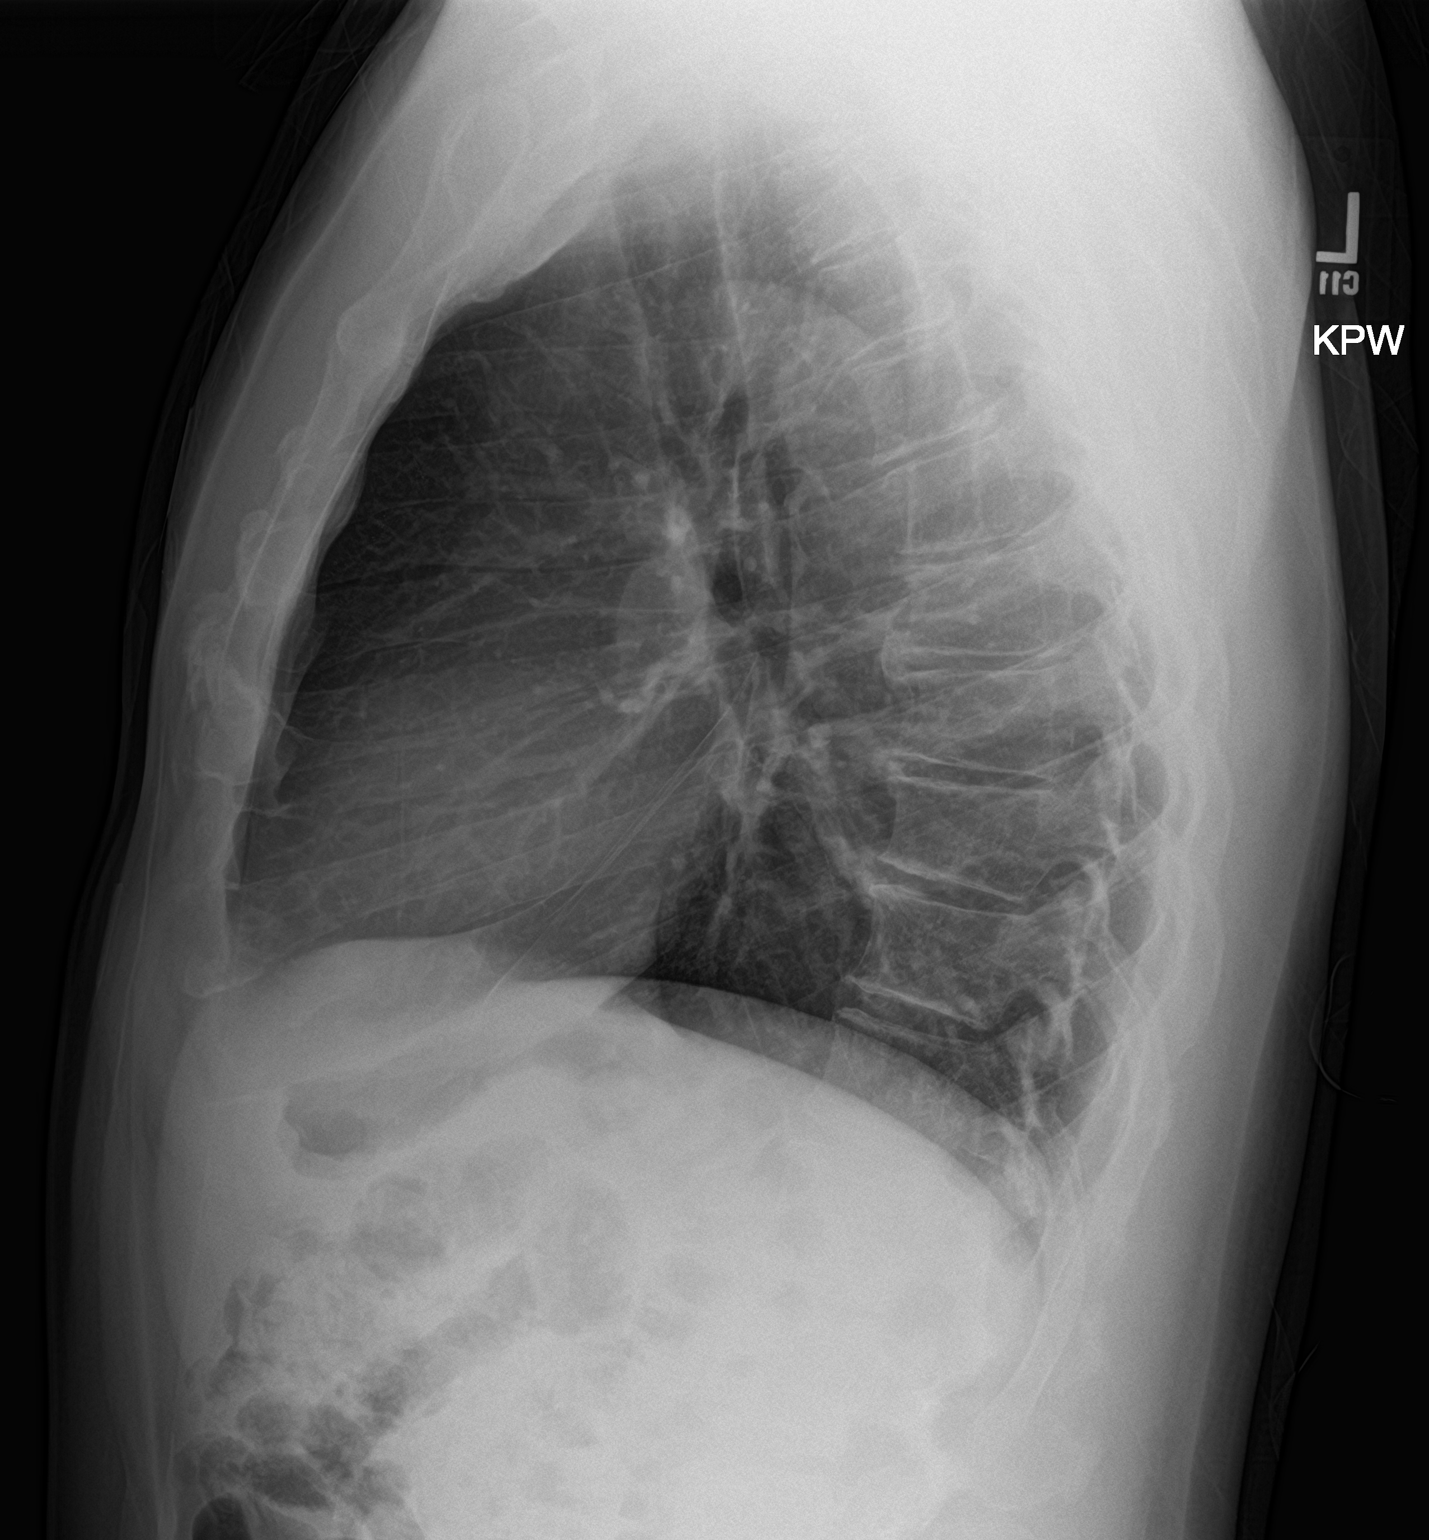

[2 of 2 positions shown; findings below may reference images not displayed]

FINDINGS: Normal heart size, mediastinal contours, and pulmonary vascularity.

Lungs clear.

No acute infiltrate, pleural effusion or pneumothorax.

Scattered endplate spur formation thoracic spine.
IMPRESSION: No acute abnormalities.

## 2020-11-13 ENCOUNTER — Encounter: Payer: Self-pay | Admitting: Emergency Medicine

## 2020-11-13 ENCOUNTER — Emergency Department: Payer: PRIVATE HEALTH INSURANCE

## 2020-11-13 ENCOUNTER — Other Ambulatory Visit: Payer: Self-pay

## 2020-11-13 ENCOUNTER — Emergency Department
Admission: EM | Admit: 2020-11-13 | Discharge: 2020-11-13 | Disposition: A | Discharge status: Home / Self Care | Payer: PRIVATE HEALTH INSURANCE | Attending: Emergency Medicine | Admitting: Emergency Medicine

## 2020-11-13 DIAGNOSIS — R5381 Other malaise: Secondary | ICD-10-CM | POA: Insufficient documentation

## 2020-11-13 DIAGNOSIS — J45909 Unspecified asthma, uncomplicated: Secondary | ICD-10-CM | POA: Insufficient documentation

## 2020-11-13 DIAGNOSIS — R079 Chest pain, unspecified: Secondary | ICD-10-CM | POA: Insufficient documentation

## 2020-11-13 LAB — CBC
HCT: 40.6 % (ref 39.0–52.0)
Hemoglobin: 14.1 g/dL (ref 13.0–17.0)
MCH: 31.1 pg (ref 26.0–34.0)
MCHC: 34.7 g/dL (ref 30.0–36.0)
MCV: 89.4 fL (ref 80.0–100.0)
Platelets: 211 10*3/uL (ref 150–400)
RBC: 4.54 MIL/uL (ref 4.22–5.81)
RDW: 12.3 % (ref 11.5–15.5)
WBC: 4.9 10*3/uL (ref 4.0–10.5)
nRBC: 0 % (ref 0.0–0.2)

## 2020-11-13 LAB — BASIC METABOLIC PANEL
Anion gap: 11 (ref 5–15)
BUN: 14 mg/dL (ref 6–20)
CO2: 24 mmol/L (ref 22–32)
Calcium: 9.6 mg/dL (ref 8.9–10.3)
Chloride: 102 mmol/L (ref 98–111)
Creatinine, Ser: 1.07 mg/dL (ref 0.61–1.24)
GFR, Estimated: >60 mL/min (ref >60)
Glucose, Bld: 107 mg/dL — ABNORMAL HIGH (ref 70–99)
Potassium: 3.9 mmol/L (ref 3.5–5.1)
Sodium: 137 mmol/L (ref 135–145)

## 2020-11-13 LAB — TROPONIN I (HIGH SENSITIVITY)
Troponin I (High Sensitivity): 3 ng/L (ref <18)
Troponin I (High Sensitivity): 4 ng/L (ref <18)

## 2020-11-13 NOTE — ED Provider Notes (Signed)
[facility] Emergency Department Provider Note   ____________________________________________   Event Date/Time   First MD Initiated Contact with Patient 11/13/20 2041     (approximate)  I have reviewed the triage vital signs and the nursing notes.   HISTORY  Chief Complaint Chest Pain    HPI Gary Nash is a 55 y.o. male with past medical history of asthma and GERD who presents to the ED complaining of chest pain.  Patient reports that he has been dealing with intermittent pain in the left side of his chest for the past 3 days.  He describes it as sharp and not exacerbated or alleviated by anything.  It has been associated with general malaise but he denies any fevers, cough, or shortness of breath.  Pain can radiate into his left arm but he denies any heavy lifting or recent trauma to either his chest or arm.  He has never had similar symptoms in the past.  He has not noticed any pain or swelling in his legs.        Past Medical History:  Diagnosis Date  . Asthma   . Benign prostate hyperplasia   . Elevated PSA   . GERD (gastroesophageal reflux disease)     There are no problems to display for this patient.   Past Surgical History:  Procedure Laterality Date  . NO PAST SURGERIES      Prior to Admission medications   Medication Sig Start Date End Date Taking? Authorizing Provider  albuterol (VENTOLIN HFA) 108 (90 Base) MCG/ACT inhaler Inhale 2 puffs into the lungs every 6 (six) hours as needed for wheezing or shortness of breath. 11/30/18   Don Perking, [place], MD  Ascorbic Acid (VITAMIN C) 1000 MG tablet Take 1 tablet daily by mouth.    [provider]  cholecalciferol (VITAMIN D) 400 units TABS tablet Take 400 Units by mouth.    [provider]  meloxicam (MOBIC) 15 MG tablet Take 1 tablet (15 mg total) by mouth daily. 11/17/19   Cuthriell, Delorise Royals, PA-C  methocarbamol (ROBAXIN) 500 MG tablet Take 1 tablet (500 mg total)  by mouth 4 (four) times daily. 11/17/19   Cuthriell, Delorise Royals, PA-C  Selenium 100 MCG CAPS Take 1 capsule daily by mouth.    [provider]  UNABLE TO FIND Take 1 capsule daily by mouth. Med Name: B-17    [provider]    Allergies Patient has no known allergies.  Family History  Problem Relation Age of Onset  . Erectile dysfunction Father   . Diabetes Father   . Healthy Mother   . Hypertension Brother     Social History Social History   Tobacco Use  . Smoking status: Never Smoker  . Smokeless tobacco: Never Used  Vaping Use  . Vaping Use: Never used  Substance Use Topics  . Alcohol use: Yes    Comment: Occasional  . Drug use: No    Review of Systems  Constitutional: No fever/chills Eyes: No visual changes. ENT: No sore throat. Cardiovascular: Positive for chest pain. Respiratory: Denies shortness of breath. Gastrointestinal: No abdominal pain.  No nausea, no vomiting.  No diarrhea.  No constipation. Genitourinary: Negative for dysuria. Musculoskeletal: Negative for back pain. Skin: Negative for rash. Neurological: Negative for headaches, focal weakness or numbness.  ____________________________________________   PHYSICAL EXAM:  VITAL SIGNS: ED Triage Vitals  Enc Vitals Group     BP 11/13/20 1939 (!) 151/86     Pulse  Rate 11/13/20 1939 70     Resp 11/13/20 1939 18     Temp 11/13/20 1939 98.1 F (36.7 C)     Temp Source 11/13/20 1939 Oral     SpO2 11/13/20 1939 100 %     Weight 11/13/20 1940 176 lb (79.8 kg)     Height 11/13/20 1940 5\' 10"  (1.778 m)     Head Circumference --      Peak Flow --      Pain Score 11/13/20 1940 6     Pain Loc --      Pain Edu? --      Excl. in GC? --     Constitutional: Alert and oriented. Eyes: Conjunctivae are normal. Head: Atraumatic. Nose: No congestion/rhinnorhea. Mouth/Throat: Mucous membranes are moist. Neck: Normal ROM Cardiovascular: Normal rate, regular rhythm. Grossly normal heart  sounds.  2+ radial pulses bilaterally. Respiratory: Normal respiratory effort.  No retractions. Lungs CTAB.  No chest wall tenderness to palpation. Gastrointestinal: Soft and nontender. No distention. Genitourinary: deferred Musculoskeletal: No lower extremity tenderness nor edema. Neurologic:  Normal speech and language. No gross focal neurologic deficits are appreciated. Skin:  Skin is warm, dry and intact. No rash noted. Psychiatric: Mood and affect are normal. Speech and behavior are normal.  ____________________________________________   LABS (all labs ordered are listed, but only abnormal results are displayed)  Labs Reviewed  BASIC METABOLIC PANEL - Abnormal; Notable for the following components:      Result Value   Glucose, Bld 107 (*)    All other components within normal limits  CBC  TROPONIN I (HIGH SENSITIVITY)  TROPONIN I (HIGH SENSITIVITY)   ____________________________________________  EKG  ED ECG REPORT I, 01/13/21, the attending physician, personally viewed and interpreted this ECG.   Date: 11/13/2020  EKG Time: 19:35  Rate: 52  Rhythm: sinus bradycardia  Axis: Normal  Intervals:none  ST&T Change: None   PROCEDURES  Procedure(s) performed (including Critical Care):  Procedures   ____________________________________________   INITIAL IMPRESSION / ASSESSMENT AND PLAN / ED COURSE       55 year old male with past medical history of asthma and GERD who presents to the ED with intermittent chest pain for the past 3 days that is sharp and not exacerbated or alleviated by anything.  He denies any chest pain currently and EKG shows no evidence of arrhythmia or ischemia.  Initial troponin is negative and additional labs are reassuring.  Chest x-ray reviewed by me and shows no infiltrate, edema, or effusion.  Symptoms are not atypical for ACS and if repeat troponin is also negative, patient would be appropriate for discharge home given his heart score  of less than 4.  I doubt PE as he is currently asymptomatic with reassuring vital signs.  Symptoms seem most likely to be musculoskeletal or related to GERD.  Repeat troponin within normal limits and patient remains chest pain-free here in the ED.  He is appropriate for discharge home with PCP follow-up, was counseled to return to the ED for new worsening symptoms, patient agrees with plan.      ____________________________________________   FINAL CLINICAL IMPRESSION(S) / ED DIAGNOSES  Final diagnoses:  Nonspecific chest pain     ED Discharge Orders    None       Note:  This document was prepared using Dragon voice recognition software and may include unintentional dictation errors.   40, MD 11/13/20 [phone]

## 2020-11-13 NOTE — ED Notes (Signed)
'  when I stand up I feel it more in my L arm, I have had a slight headache and decreased appetite, denies cp now . I been resting since Tuesday and went back to work today but I am just off and feel so weak.' denies any n/v/d

## 2020-11-13 NOTE — ED Triage Notes (Signed)
Pt comes into the ED via POV c/o left side chest pain that started about Tuesday.  PT states it radiates into his left arm and left shoulder blade.  Pt denies any cardiac history and is ambulatory to triage at this time.  Pt states he also just felt "off" since Tuesday but attributed it to working long shifts.  Pt has even and unlabored respirations at this time.  Pt has dizziness and nausea but denies any SHOB.

## 2021-10-06 ENCOUNTER — Emergency Department: Payer: BC Managed Care – PPO

## 2021-10-06 ENCOUNTER — Emergency Department
Admission: EM | Admit: 2021-10-06 | Discharge: 2021-10-06 | Disposition: A | Discharge status: Home / Self Care | Payer: BC Managed Care – PPO | Attending: Emergency Medicine | Admitting: Emergency Medicine

## 2021-10-06 ENCOUNTER — Other Ambulatory Visit: Payer: Self-pay

## 2021-10-06 DIAGNOSIS — R0789 Other chest pain: Secondary | ICD-10-CM | POA: Diagnosis not present

## 2021-10-06 DIAGNOSIS — J45909 Unspecified asthma, uncomplicated: Secondary | ICD-10-CM | POA: Diagnosis not present

## 2021-10-06 DIAGNOSIS — H538 Other visual disturbances: Secondary | ICD-10-CM | POA: Insufficient documentation

## 2021-10-06 DIAGNOSIS — R519 Headache, unspecified: Secondary | ICD-10-CM | POA: Insufficient documentation

## 2021-10-06 DIAGNOSIS — D72819 Decreased white blood cell count, unspecified: Secondary | ICD-10-CM | POA: Diagnosis not present

## 2021-10-06 DIAGNOSIS — R200 Anesthesia of skin: Secondary | ICD-10-CM | POA: Diagnosis not present

## 2021-10-06 DIAGNOSIS — R202 Paresthesia of skin: Secondary | ICD-10-CM | POA: Insufficient documentation

## 2021-10-06 LAB — DIFFERENTIAL
Abs Immature Granulocytes: 0 10*3/uL (ref 0.00–0.07)
Basophils Absolute: 0 10*3/uL (ref 0.0–0.1)
Basophils Relative: 0 %
Eosinophils Absolute: 0 10*3/uL (ref 0.0–0.5)
Eosinophils Relative: 1 %
Immature Granulocytes: 0 %
Lymphocytes Relative: 45 %
Lymphs Abs: 1.6 10*3/uL (ref 0.7–4.0)
Monocytes Absolute: 0.5 10*3/uL (ref 0.1–1.0)
Monocytes Relative: 13 %
Neutro Abs: 1.5 10*3/uL — ABNORMAL LOW (ref 1.7–7.7)
Neutrophils Relative %: 41 %

## 2021-10-06 LAB — CBC
HCT: 41.6 % (ref 39.0–52.0)
Hemoglobin: 14.4 g/dL (ref 13.0–17.0)
MCH: 30.6 pg (ref 26.0–34.0)
MCHC: 34.6 g/dL (ref 30.0–36.0)
MCV: 88.3 fL (ref 80.0–100.0)
Platelets: 202 10*3/uL (ref 150–400)
RBC: 4.71 MIL/uL (ref 4.22–5.81)
RDW: 12.5 % (ref 11.5–15.5)
WBC: 3.6 10*3/uL — ABNORMAL LOW (ref 4.0–10.5)
nRBC: 0 % (ref 0.0–0.2)

## 2021-10-06 LAB — TROPONIN I (HIGH SENSITIVITY): Troponin I (High Sensitivity): 3 ng/L (ref <18)

## 2021-10-06 LAB — COMPREHENSIVE METABOLIC PANEL
ALT: 21 U/L (ref 0–44)
AST: 18 U/L (ref 15–41)
Albumin: 4.3 g/dL (ref 3.5–5.0)
Alkaline Phosphatase: 46 U/L (ref 38–126)
Anion gap: 6 (ref 5–15)
BUN: 13 mg/dL (ref 6–20)
CO2: 25 mmol/L (ref 22–32)
Calcium: 9.2 mg/dL (ref 8.9–10.3)
Chloride: 104 mmol/L (ref 98–111)
Creatinine, Ser: 1.07 mg/dL (ref 0.61–1.24)
GFR, Estimated: >60 mL/min (ref >60)
Glucose, Bld: 96 mg/dL (ref 70–99)
Potassium: 4.1 mmol/L (ref 3.5–5.1)
Sodium: 135 mmol/L (ref 135–145)
Total Bilirubin: 0.7 mg/dL (ref 0.3–1.2)
Total Protein: 7.9 g/dL (ref 6.5–8.1)

## 2021-10-06 LAB — PROTIME-INR
INR: 1 (ref 0.8–1.2)
Prothrombin Time: 13.3 seconds (ref 11.4–15.2)

## 2021-10-06 LAB — APTT: aPTT: 28 seconds (ref 24–36)

## 2021-10-06 NOTE — ED Triage Notes (Signed)
Pt comes pov with headache, right sided numbness and tingling, and eye pain off and on for the past couple of days made worse after playing video games. No weakness, no grip difference. States that the tingling is better at this time and the headache is slight. No other neuro deficits.  ?

## 2021-10-06 NOTE — ED Provider Notes (Signed)
? ?[facility] ?Provider Note ? ? ? Event Date/Time  ? First MD Initiated Contact with Patient 10/06/21 1733   ?  (approximate) ? ? ?History  ? ?Headache ? ? ?HPI ? ?Gary Nash is a 56 y.o. male with a past medical history of reflux and asthma who presents for evaluation of headache associated with some right-sided facial and arm numbness as well as some blurry vision.  Patient states this started last night when he could not sleep and started playing videogames.  He stated he had something similar the night before when he started playing video games and developed a headache.  He states he tried to play longer tonight and notes when his symptoms got particularly severe and since he stopped playing they have gotten a little better.  States he had something similar happen about 2 months ago after a dental surgery on the right when he tried to play videogames after that and developed a headache and some vision problems and paresthesias in the right side of the face.  Tonight before last night was the first time he played again he has not been playing otherwise intermittently or had other similar episodes.  He states that this morning he also had a little bit of chest tightness which he thought was his reflux but no other recent chest pain or subsequent chest pain.  No double vision, vertigo, earache, sore throat, neck pain, cough, shortness of breath, abdominal pain, nausea, vomiting, diarrhea, constipation, urinary symptoms or focal extremity weakness or numbness.  He denies EtOH use, illicit drug use or tobacco abuse.  No recent trauma injury or falls.  No other acute concerns at this time. ? ?  ? ? ?Physical Exam  ?Triage Vital Signs: ?ED Triage Vitals [10/06/21 1522]  ?Enc Vitals Group  ?   BP 139/87  ?   Pulse Rate 63  ?   Resp 18  ?   Temp 98.1 ?F (36.7 ?C)  ?   Temp Source Oral  ?   SpO2 100 %  ?   Weight 176 lb (79.8 kg)  ?   Height   ?   Head Circumference   ?   Peak Flow   ?   Pain  Score 5  ?   Pain Loc   ?   Pain Edu?   ?   Excl. in [place]?   ? ? ?Most recent vital signs: ?Vitals:  ? 10/06/21 1800 10/06/21 1810  ?BP: 124/84   ?Pulse:  60  ?Resp:  20  ?Temp:    ?SpO2:  100%  ? ? ?General: Awake, no distress.  ?CV:  Good peripheral perfusion.  2+ radial pulses. ?Resp:  Normal effort.  Clear bilaterally. ?Abd:  No distention.  Soft throughout. ?Other:  Cranial nerves II through XII grossly intact.  No pronator drift.  No finger dysmetria.  Symmetric 5/5 strength of all extremities.  Sensation intact to light touch in all extremities.  Unremarkable unassisted gait. ? ?Visual acuity is 20/25 bilaterally.  Oropharynx is unremarkable. ? ? ? ?ED Results / Procedures / Treatments  ?Labs ?(all labs ordered are listed, but only abnormal results are displayed) ?Labs Reviewed  ?CBC - Abnormal; Notable for the following components:  ?    Result Value  ? WBC 3.6 (*)   ? All other components within normal limits  ?DIFFERENTIAL - Abnormal; Notable for the following components:  ? Neutro Abs 1.5 (*)   ? All other components within normal  limits  ?PROTIME-INR  ?APTT  ?COMPREHENSIVE METABOLIC PANEL  ?CBG MONITORING, ED  ?TROPONIN I (HIGH SENSITIVITY)  ? ? ? ?EKG ? ?ECG is remarkable for sinus bradycardia with some sinus arrhythmia and some nonspecific ST change in aVL and lead III without any other clear evidence of acute ischemia or significant arrhythmia. ? ? ?RADIOLOGY ? ?CT head reviewed by myself without evidence of hemorrhage, ischemia, mass effect or other acute intracranial process.  I also reviewed radiology interpretation. ? ? ?PROCEDURES: ? ?Critical Care performed: No ? ?Procedures ? ? ? ?MEDICATIONS ORDERED IN ED: ?Medications - No data to display ? ? ?IMPRESSION / MDM / ASSESSMENT AND PLAN / ED COURSE  ?I reviewed the triage vital signs and the nursing notes. ?             ?               ? ?Differential diagnosis includes, but is not limited to atypical migraine, SAH, possible headache secondary to an  astigmatism or otherwise related to visual stimuli from playing videogames.  He is no evidence of any neurological objective deficits and has sensation intact throughout the right upper extremity and right side of the face including in distributions of V1, V2, and V3.  Overall a very low suspicion for CVA at this time.  No eye pain or pupil abnormalities at this point to suggest glaucoma and no findings on history or exam to suggest trauma or infectious process. ? ?He states he is feeling much better but wanted to get checked out. ? ?CT head reviewed by myself without evidence of hemorrhage, ischemia, mass effect or other acute intracranial process.  I also reviewed radiology interpretation. ? ?ECG and nonelevated troponin are not suggestive of an acute ischemic event.  CMP shows no significant electrolyte or metabolic derangements.  CBC shows some chronic leukopenia with WBC count 3.6 without evidence of acute anemia and normal platelets.  INR and PTT obtained at triage are unremarkable. ? ?Given patient is feeling better with a nonfocal neurological exam reassuring head CT symptoms precipitated by exposure to what sounds like possibly excessive screen time and video games think he is stable for discharge with close outpatient PCP and ophthalmology follow-up.  I have a low suspicion for immediate life-threatening process advised patient to return to immediately to the emergency room if he experiences any new or worsening of symptoms.  Discharged in stable condition.  Strict return precautions advised and discussed. ? ?  ? ? ?FINAL CLINICAL IMPRESSION(S) / ED DIAGNOSES  ? ?Final diagnoses:  ?Bad headache  ?Paresthesia  ?Chest tightness  ? ? ? ?Rx / DC Orders  ? ?ED Discharge Orders   ? ? None  ? ?  ? ? ? ?Note:  This document was prepared using Dragon voice recognition software and may include unintentional dictation errors. ?  ?Gilles Chiquito, MD ?10/06/21 1826 ? ?

## 2022-07-16 ENCOUNTER — Emergency Department
Admission: EM | Admit: 2022-07-16 | Discharge: 2022-07-16 | Disposition: A | Discharge status: Home / Self Care | Payer: BC Managed Care – PPO | Attending: Emergency Medicine | Admitting: Emergency Medicine

## 2022-07-16 ENCOUNTER — Other Ambulatory Visit: Payer: Self-pay

## 2022-07-16 ENCOUNTER — Emergency Department: Payer: BC Managed Care – PPO

## 2022-07-16 DIAGNOSIS — R42 Dizziness and giddiness: Secondary | ICD-10-CM | POA: Diagnosis not present

## 2022-07-16 DIAGNOSIS — R519 Headache, unspecified: Secondary | ICD-10-CM | POA: Diagnosis present

## 2022-07-16 LAB — URINALYSIS, ROUTINE W REFLEX MICROSCOPIC
Bacteria, UA: NONE SEEN
Bilirubin Urine: NEGATIVE
Glucose, UA: NEGATIVE mg/dL
Hgb urine dipstick: NEGATIVE
Ketones, ur: NEGATIVE mg/dL
Nitrite: NEGATIVE
Protein, ur: NEGATIVE mg/dL
Specific Gravity, Urine: 1.026 (ref 1.005–1.030)
Squamous Epithelial / HPF: NONE SEEN /HPF (ref 0–5)
WBC, UA: >50 WBC/hpf — ABNORMAL HIGH (ref 0–5)
pH: 6 (ref 5.0–8.0)

## 2022-07-16 LAB — CBC
HCT: 40.9 % (ref 39.0–52.0)
Hemoglobin: 13.9 g/dL (ref 13.0–17.0)
MCH: 30.8 pg (ref 26.0–34.0)
MCHC: 34 g/dL (ref 30.0–36.0)
MCV: 90.5 fL (ref 80.0–100.0)
Platelets: 196 10*3/uL (ref 150–400)
RBC: 4.52 MIL/uL (ref 4.22–5.81)
RDW: 12.5 % (ref 11.5–15.5)
WBC: 3.6 10*3/uL — ABNORMAL LOW (ref 4.0–10.5)
nRBC: 0 % (ref 0.0–0.2)

## 2022-07-16 LAB — BASIC METABOLIC PANEL
Anion gap: 7 (ref 5–15)
BUN: 19 mg/dL (ref 6–20)
CO2: 27 mmol/L (ref 22–32)
Calcium: 9.2 mg/dL (ref 8.9–10.3)
Chloride: 103 mmol/L (ref 98–111)
Creatinine, Ser: 1.06 mg/dL (ref 0.61–1.24)
GFR, Estimated: >60 mL/min (ref >60)
Glucose, Bld: 104 mg/dL — ABNORMAL HIGH (ref 70–99)
Potassium: 3.7 mmol/L (ref 3.5–5.1)
Sodium: 137 mmol/L (ref 135–145)

## 2022-07-16 LAB — TROPONIN I (HIGH SENSITIVITY): Troponin I (High Sensitivity): 4 ng/L (ref <18)

## 2022-07-16 MED ORDER — NAPROXEN 500 MG PO TABS: 500.0000 mg | ORAL_TABLET | Freq: Two times a day (BID) | ORAL | 2 refills | Status: DC

## 2022-07-16 MED ORDER — KETOROLAC TROMETHAMINE 30 MG/ML IJ SOLN
30.0000 mg | Freq: Once | INTRAMUSCULAR | Status: AC
  Administered 2022-07-16: 30 mg via INTRAMUSCULAR
  Filled 2022-07-16: qty 1

## 2022-07-16 NOTE — ED Notes (Addendum)
See triage note. Pt came POV. Has dizziness and headache on both sides on temple to both eyes down neck. Also sick to stomach, nauseous, weak all over. States he played a video game and started to feel funny around 330am. Around 10 Took a shower and just felt weak and numb on L side of body and R side when he got to the hospital now just L side. States this did happen to him around 5/6 months ago when he also tried to play video games. Watching television does not give him this issue. Pt is A&Ox4 and ambulatory. Denies being on any new meds or any recent medical changes.

## 2022-07-16 NOTE — ED Triage Notes (Signed)
Pt comes from home via POV c/o headache. Headache started 1hr and half ago. Pt states that 5-6 months ago he tried to play video games but got a headache and felt sick afterwards. After that, he would get a headache after playing video games. Today he started to play videogames again and developed a headache and felt weak and dizzy. Pt also feels nauseous and left arm feels numb. Pt A&Ox4, gait even and steady

## 2022-07-16 NOTE — ED Provider Notes (Signed)
[facility] Provider Note    Event Date/Time   First MD Initiated Contact with Patient 07/16/22 [phone]     (approximate)   History   Headache and Dizziness   HPI  Gary Nash is a 57 y.o. male who reports developing a headache after playing videogames.  Patient reports this has been the case for him over the last year.  Whenever he tries to lay videogames he develops a right-sided headache and feels somewhat dizzy.  He is feeling improved now but continues to feel right-sided headache.  No neurodeficits.  No change in vision     Physical Exam   Triage Vital Signs: ED Triage Vitals  Enc Vitals Group     BP 07/16/22 0613 137/85     Pulse Rate 07/16/22 0613 66     Resp 07/16/22 0613 18     Temp 07/16/22 0613 98 F (36.7 C)     Temp Source 07/16/22 0613 Oral     SpO2 07/16/22 0613 99 %     Weight 07/16/22 0615 86.2 kg (190 lb)     Height 07/16/22 0615 1.778 m (5\' 10" )     Head Circumference --      Peak Flow --      Pain Score 07/16/22 0614 7     Pain Loc --      Pain Edu? --      Excl. in [place]? --     Most recent vital signs: Vitals:   07/16/22 0613 07/16/22 0743  BP: 137/85   Pulse: 66   Resp: 18   Temp: 98 F (36.7 C)   SpO2: 99% 98%     General: Awake, no distress.  CV:  Good peripheral perfusion.  Resp:  Normal effort.  Abd:  No distention.  Other:  Cranial nerves II through XII are normal, normal ambulation   ED Results / Procedures / Treatments   Labs (all labs ordered are listed, but only abnormal results are displayed) Labs Reviewed  BASIC METABOLIC PANEL - Abnormal; Notable for the following components:      Result Value   Glucose, Bld 104 (*)    All other components within normal limits  CBC - Abnormal; Notable for the following components:   WBC 3.6 (*)    All other components within normal limits  URINALYSIS, ROUTINE W REFLEX MICROSCOPIC - Abnormal; Notable for the following components:   Color, Urine YELLOW  (*)    APPearance HAZY (*)    Leukocytes,Ua SMALL (*)    WBC, UA >50 (*)    All other components within normal limits  CBG MONITORING, ED  TROPONIN I (HIGH SENSITIVITY)     EKG  ED ECG REPORT I, Lavonia Drafts, the attending physician, personally viewed and interpreted this ECG.  Date: 07/16/2022  Rhythm: normal sinus rhythm QRS Axis: normal Intervals: normal ST/T Wave abnormalities: Nonspecific changes Narrative Interpretation: no evidence of acute ischemia    RADIOLOGY CT head viewed interpret by me, no ICH    PROCEDURES:  Critical Care performed:   Procedures   MEDICATIONS ORDERED IN ED: Medications  ketorolac (TORADOL) 30 MG/ML injection 30 mg (30 mg Intramuscular Given 07/16/22 0754)     IMPRESSION / MDM / ASSESSMENT AND PLAN / ED COURSE  I reviewed the triage vital signs and the nursing notes. Patient's presentation is most consistent with acute illness / injury with system symptoms.  Patient presents with right-sided headache as detailed above, no red flag or  neurological symptoms.  He has had the several times before.  Question migraine possibly triggered by videogames, he says that he typically is able to watch TV without discomfort.  No fevers or chills, lab work reviewed and is overall reassuring.  CT head unremarkable.  Appropriate for discharge at this time with outpatient follow-up with neurology, strict return precautions discussed.        FINAL CLINICAL IMPRESSION(S) / ED DIAGNOSES   Final diagnoses:  Acute nonintractable headache, unspecified headache type     Rx / DC Orders   ED Discharge Orders          Ordered    naproxen (NAPROSYN) 500 MG tablet  2 times daily with meals        07/16/22 0909             Note:  This document was prepared using Dragon voice recognition software and may include unintentional dictation errors.   Jene Every, MD 07/16/22 1258

## 2022-11-02 ENCOUNTER — Ambulatory Visit
Admission: EM | Admit: 2022-11-02 | Discharge: 2022-11-02 | Disposition: A | Discharge status: Home / Self Care | Payer: Self-pay | Attending: Internal Medicine | Admitting: Internal Medicine

## 2022-11-02 DIAGNOSIS — R079 Chest pain, unspecified: Secondary | ICD-10-CM

## 2022-11-02 DIAGNOSIS — R519 Headache, unspecified: Secondary | ICD-10-CM | POA: Insufficient documentation

## 2022-11-02 DIAGNOSIS — R001 Bradycardia, unspecified: Secondary | ICD-10-CM | POA: Insufficient documentation

## 2022-11-02 DIAGNOSIS — I499 Cardiac arrhythmia, unspecified: Secondary | ICD-10-CM | POA: Insufficient documentation

## 2022-11-02 LAB — TROPONIN I (HIGH SENSITIVITY): Troponin I (High Sensitivity): 5 ng/L (ref <18)

## 2022-11-02 NOTE — ED Provider Notes (Signed)
[facility]    CSN: [id number] Arrival date & time: 11/02/22  1359      History   Chief Complaint Chief Complaint  Patient presents with   Irregular Heart Beat   Headache    HPI Gary Nash is a 57 y.o. male.   -Patient is a 57 year old male who presents with slight headache x 2 or 3 days as well as concern for irregular heartbeat.  Patient was seen in the ER back in January for a headache that developed after playing videogames.  He states he got pain shot at that time as well as prescription but states he has never taken the medication as he has not needed it.  Patient reports his headache is slight for 2 to 3 days but states it had been bad enough for him to take any medicine for it.  Patient denies any light or sound intolerance.  Patient also reports feeling a fluttering in his heartbeat.  He states that it feels like he will have a heartbeat and then he will have a little hesitation and then the next beat.  Patient does report some intermittent numbness to the left arm or left hand but no real chest pain, shortness of breath or abdominal discomfort.  Patient states has been told about irregular heartbeat in the past does not been seen by cardiologist for it.  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  Patient has an allergy to azithromycin    Past Medical History:  Diagnosis Date   Asthma    Benign prostate hyperplasia    Elevated PSA    GERD (gastroesophageal reflux disease)     There are no problems to display for this patient.   Past Surgical History:  Procedure Laterality Date   NO PAST SURGERIES         Home Medications    Prior to Admission medications   Medication Sig Start Date End Date Taking? Authorizing Provider  albuterol (VENTOLIN HFA) 108 (90 Base) MCG/ACT inhaler Inhale 2 puffs into the lungs every 6 (six) hours as needed for wheezing or shortness of breath. 11/30/18  Yes Don Perking, [place], MD  Ascorbic Acid (VITAMIN C) 1000  MG tablet Take 1 tablet daily by mouth.   Yes [provider]  cholecalciferol (VITAMIN D) 400 units TABS tablet Take 400 Units by mouth.   Yes [provider]  naproxen (NAPROSYN) 500 MG tablet Take 1 tablet (500 mg total) by mouth 2 (two) times daily with a meal. 07/16/22   Jene Every, MD  Selenium 100 MCG CAPS Take 1 capsule daily by mouth.    [provider]    Family History Family History  Problem Relation Age of Onset   Erectile dysfunction Father    Diabetes Father    Healthy Mother    Hypertension Brother     Social History Social History   Tobacco Use   Smoking status: Never   Smokeless tobacco: Never  Vaping Use   Vaping Use: Never used  Substance Use Topics   Alcohol use: Yes    Comment: Occasional   Drug use: No     Allergies   Azithromycin   Review of Systems Review of Systems as above in HPI.  Other systems reviewed and found to be negative   Physical Exam Triage Vital Signs ED Triage Vitals  Enc Vitals Group     BP 11/02/22 1424 (!) 153/90     Pulse Rate 11/02/22 1424 77  Resp 11/02/22 1424 18     Temp 11/02/22 1424 98.1 F (36.7 C)     Temp Source 11/02/22 1424 Oral     SpO2 --      Weight --      Height --      Head Circumference --      Peak Flow --      Pain Score 11/02/22 1420 0     Pain Loc --      Pain Edu? --      Excl. in [place]? --    No data found.  Updated Vital Signs BP (!) 153/90 (BP Location: Right Arm)   Pulse 77   Temp 98.1 F (36.7 C) (Oral)   Resp 18   Visual Acuity Right Eye Distance:   Left Eye Distance:   Bilateral Distance:    Right Eye Near:   Left Eye Near:    Bilateral Near:     Physical Exam Constitutional:      Appearance: He is well-developed.  HENT:     Head: Normocephalic and atraumatic.  Cardiovascular:     Rate and Rhythm: Regular rhythm. Bradycardia present.     Heart sounds: Normal heart sounds.  Pulmonary:     Effort: Pulmonary effort is normal.      Breath sounds: Normal breath sounds.  Abdominal:     General: Bowel sounds are normal.     Palpations: Abdomen is soft.  Musculoskeletal:        General: Normal range of motion.  Skin:    General: Skin is warm and dry.  Neurological:     Mental Status: He is alert and oriented to person, place, and time.     Cranial Nerves: No cranial nerve deficit.  Psychiatric:        Mood and Affect: Mood normal.        Behavior: Behavior normal.      UC Treatments / Results  Labs (all labs ordered are listed, but only abnormal results are displayed) Labs Reviewed  TROPONIN I (HIGH SENSITIVITY)    EKG Sinus bradycardia with rate of 51.  QTc of 357.  No ST abnormalities.  No arrhythmia (my read)  Radiology No results found.  Procedures Procedures (including critical care time)  Medications Ordered in UC Medications - No data to display  Initial Impression / Assessment and Plan / UC Course  I have reviewed the triage vital signs and the nursing notes.  Pertinent labs & imaging results that were available during my care of the patient were reviewed by me and considered in my medical decision making (see chart for details).    Patient presents with a headache x 2 to 3 days that he reports is slight does not feel has reasonable level that he would need to take something for it.  Last headache was about 3 months ago but that went developed after playing videogames.  Patient was given prescriptions that time for headache medication but has not needed to take any of it.  EKG shows a sinus bradycardia 51 with no ST changes and no arrhythmia.  Evaluating EKGs available in patient's chart however last couple years, most of them have a sinus rhythm in the 50s.  Send those report a sinus bradycardia with a regular rhythm but a couple of them also report a sinus arrhythmia.  Patient has not been seen by cardiology for this in the past.  This is likely a normal variant for the patient.  Will check  a  high-sensitivity troponin but have low suspicion for anything acute. Final Clinical Impressions(s) / UC Diagnoses   Final diagnoses:  Nonintractable episodic headache, unspecified headache type  Irregular heartbeat  Sinus bradycardia on ECG     Discharge Instructions      -For headache, recommend ibuprofen and Tylenol as needed for pain.  With return to clinic should symptoms worsen.  Possible follow-up with your primary care should they become more frequent -In reviewing your EKG, your previous test going back the last couple years, been fairly similar.  Typically have a heart rate in the 50s with no major changes.  A couple of them do have something that is noted as sinus arrhythmia which is typically a slight variation in the regularity of your heartbeats.  This is likely a normal variant for you as it is not something that is continuous.  If you do have periods of irregularity, you can make a journal of timing and what activities you are doing at that point.  He could not present that to your primary care who could order further workup if needed. -The heart lab is still pending.  Can follow-up on the result in your MyChart.  If it warrants further follow-up, we will notify you -Follow-up primary care as needed     ED Prescriptions   None    PDMP not reviewed this encounter.   Candis Schatz, PA-C 11/02/22 1540

## 2022-11-02 NOTE — ED Triage Notes (Signed)
Pt reports slight HA on R temple x 2-3 days.  Was seen in ED in January for R sided HA and has not had a HA like that since the ED.   Also reports taking half a Zyrtec 2-3 days ago and has had intermittent heart fluttering and having to consciously breathe since then.  Reports some numbness in L arm and slight pain in chest but states that may be related to acid reflux.

## 2022-11-02 NOTE — Discharge Instructions (Signed)
-  For headache, recommend ibuprofen and Tylenol as needed for pain.  With return to clinic should symptoms worsen.  Possible follow-up with your primary care should they become more frequent -In reviewing your EKG, your previous test going back the last couple years, been fairly similar.  Typically have a heart rate in the 50s with no major changes.  A couple of them do have something that is noted as sinus arrhythmia which is typically a slight variation in the regularity of your heartbeats.  This is likely a normal variant for you as it is not something that is continuous.  If you do have periods of irregularity, you can make a journal of timing and what activities you are doing at that point.  He could not present that to your primary care who could order further workup if needed. -The heart lab is still pending.  Can follow-up on the result in your MyChart.  If it warrants further follow-up, we will notify you -Follow-up primary care as needed

## 2022-11-25 ENCOUNTER — Other Ambulatory Visit: Payer: Self-pay

## 2022-11-25 ENCOUNTER — Emergency Department: Payer: BC Managed Care – PPO

## 2022-11-25 ENCOUNTER — Emergency Department
Admission: EM | Admit: 2022-11-25 | Discharge: 2022-11-25 | Disposition: A | Discharge status: Home / Self Care | Payer: BC Managed Care – PPO | Attending: Emergency Medicine | Admitting: Emergency Medicine

## 2022-11-25 ENCOUNTER — Encounter: Payer: Self-pay | Admitting: Emergency Medicine

## 2022-11-25 DIAGNOSIS — R5383 Other fatigue: Secondary | ICD-10-CM | POA: Diagnosis not present

## 2022-11-25 DIAGNOSIS — R1031 Right lower quadrant pain: Secondary | ICD-10-CM | POA: Insufficient documentation

## 2022-11-25 DIAGNOSIS — R519 Headache, unspecified: Secondary | ICD-10-CM | POA: Insufficient documentation

## 2022-11-25 DIAGNOSIS — R6883 Chills (without fever): Secondary | ICD-10-CM | POA: Diagnosis not present

## 2022-11-25 DIAGNOSIS — J111 Influenza due to unidentified influenza virus with other respiratory manifestations: Secondary | ICD-10-CM

## 2022-11-25 DIAGNOSIS — R1012 Left upper quadrant pain: Secondary | ICD-10-CM | POA: Insufficient documentation

## 2022-11-25 DIAGNOSIS — R109 Unspecified abdominal pain: Secondary | ICD-10-CM | POA: Diagnosis present

## 2022-11-25 LAB — BASIC METABOLIC PANEL
Anion gap: 10 (ref 5–15)
BUN: 15 mg/dL (ref 6–20)
CO2: 24 mmol/L (ref 22–32)
Calcium: 9.3 mg/dL (ref 8.9–10.3)
Chloride: 103 mmol/L (ref 98–111)
Creatinine, Ser: 1.09 mg/dL (ref 0.61–1.24)
GFR, Estimated: >60 mL/min (ref >60)
Glucose, Bld: 106 mg/dL — ABNORMAL HIGH (ref 70–99)
Potassium: 3.4 mmol/L — ABNORMAL LOW (ref 3.5–5.1)
Sodium: 137 mmol/L (ref 135–145)

## 2022-11-25 LAB — CBC
HCT: 39.2 % (ref 39.0–52.0)
Hemoglobin: 13.3 g/dL (ref 13.0–17.0)
MCH: 30.5 pg (ref 26.0–34.0)
MCHC: 33.9 g/dL (ref 30.0–36.0)
MCV: 89.9 fL (ref 80.0–100.0)
Platelets: 190 10*3/uL (ref 150–400)
RBC: 4.36 MIL/uL (ref 4.22–5.81)
RDW: 12.1 % (ref 11.5–15.5)
WBC: 3.6 10*3/uL — ABNORMAL LOW (ref 4.0–10.5)
nRBC: 0 % (ref 0.0–0.2)

## 2022-11-25 LAB — TROPONIN I (HIGH SENSITIVITY)
Troponin I (High Sensitivity): 4 ng/L (ref <18)
Troponin I (High Sensitivity): 4 ng/L (ref <18)

## 2022-11-25 LAB — HEPATIC FUNCTION PANEL
ALT: 18 U/L (ref 0–44)
AST: 20 U/L (ref 15–41)
Albumin: 4.2 g/dL (ref 3.5–5.0)
Alkaline Phosphatase: 44 U/L (ref 38–126)
Bilirubin, Direct: <0.1 mg/dL (ref 0.0–0.2)
Total Bilirubin: 0.7 mg/dL (ref 0.3–1.2)
Total Protein: 7.4 g/dL (ref 6.5–8.1)

## 2022-11-25 LAB — LIPASE, BLOOD: Lipase: 40 U/L (ref 11–51)

## 2022-11-25 MED ORDER — METOCLOPRAMIDE HCL 10 MG PO TABS: 10.0000 mg | ORAL_TABLET | Freq: Four times a day (QID) | ORAL | 0 refills | Status: DC | PRN

## 2022-11-25 MED ORDER — FAMOTIDINE 20 MG PO TABS: 20.0000 mg | ORAL_TABLET | Freq: Two times a day (BID) | ORAL | 0 refills | Status: DC

## 2022-11-25 MED ORDER — IOHEXOL 300 MG/ML  SOLN
100.0000 mL | Freq: Once | INTRAMUSCULAR | Status: AC | PRN
  Administered 2022-11-25: 100 mL via INTRAVENOUS

## 2022-11-25 MED ORDER — KETOROLAC TROMETHAMINE 15 MG/ML IJ SOLN
15.0000 mg | Freq: Once | INTRAMUSCULAR | Status: AC
  Administered 2022-11-25: 15 mg via INTRAVENOUS
  Filled 2022-11-25: qty 1

## 2022-11-25 MED ORDER — ONDANSETRON HCL 4 MG/2ML IJ SOLN
4.0000 mg | Freq: Once | INTRAMUSCULAR | Status: AC
  Administered 2022-11-25: 4 mg via INTRAVENOUS
  Filled 2022-11-25: qty 2

## 2022-11-25 MED ORDER — SUCRALFATE 1 G PO TABS: 1.0000 g | ORAL_TABLET | Freq: Four times a day (QID) | ORAL | 1 refills | Status: DC

## 2022-11-25 MED ORDER — PANTOPRAZOLE SODIUM 40 MG IV SOLR
40.0000 mg | Freq: Once | INTRAVENOUS | Status: AC
  Administered 2022-11-25: 40 mg via INTRAVENOUS
  Filled 2022-11-25: qty 10

## 2022-11-25 MED ORDER — SODIUM CHLORIDE 0.9 % IV BOLUS
1000.0000 mL | Freq: Once | INTRAVENOUS | Status: AC
  Administered 2022-11-25: 1000 mL via INTRAVENOUS

## 2022-11-25 NOTE — ED Notes (Signed)
Pt verbalizes understanding of discharge instructions. Opportunity for questioning and answers were provided. Pt discharged from ED to home by self.    

## 2022-11-25 NOTE — ED Notes (Signed)
MD at bedside. 

## 2022-11-25 NOTE — ED Triage Notes (Signed)
Pt here with a headache, fatigue, and slight cp per pt x 3 days. Pt states he feels like his blood pressure is up and his right arm tingles occasionally. Pt states his cp is left sided and radiates to his arm. Pt describes the pain as dull and is intermittent. Pt endorses nausea but denies vomiting and diarrhea.

## 2022-11-25 NOTE — ED Provider Notes (Signed)
[facility] Provider Note    Event Date/Time   First MD Initiated Contact with Patient 11/25/22 1119     (approximate)   History   Chief Complaint: Chest Pain   HPI  Gary Nash is a 57 y.o. male with a past history of GERD who comes ED complaining of headache, fatigue, chills, upper abdominal pain for the last 3 days.  Waxing and waning.  Abdominal pain is somewhat worsened by eating.  Feels like GERD.  No chest pain or shortness of breath, no diaphoresis vomiting or exertional symptoms.  No known sick contacts.     Physical Exam   Triage Vital Signs: ED Triage Vitals [11/25/22 1014]  Enc Vitals Group     BP (!) 145/78     Pulse Rate 66     Resp 18     Temp 97.8 F (36.6 C)     Temp Source Oral     SpO2 100 %     Weight 190 lb 0.6 oz (86.2 kg)     Height 5\' 10"  (1.778 m)     Head Circumference      Peak Flow      Pain Score 7     Pain Loc      Pain Edu?      Excl. in GC?     Most recent vital signs: Vitals:   11/25/22 1014  BP: (!) 145/78  Pulse: 66  Resp: 18  Temp: 97.8 F (36.6 C)  SpO2: 100%    General: Awake, no distress.  CV:  Good peripheral perfusion.  Regular rate and rhythm Resp:  Normal effort.  Clear to auscultation bilaterally Abd:  No distention.  Soft with mild left upper quadrant and right lower quadrant tenderness Other:  Somewhat dry mucous membranes   ED Results / Procedures / Treatments   Labs (all labs ordered are listed, but only abnormal results are displayed) Labs Reviewed  BASIC METABOLIC PANEL - Abnormal; Notable for the following components:      Result Value   Potassium 3.4 (*)    Glucose, Bld 106 (*)    All other components within normal limits  CBC - Abnormal; Notable for the following components:   WBC 3.6 (*)    All other components within normal limits  LIPASE, BLOOD  HEPATIC FUNCTION PANEL  TROPONIN I (HIGH SENSITIVITY)  TROPONIN I (HIGH SENSITIVITY)     EKG Interpreted by  me Sinus rhythm rate of 56.  Normal axis intervals QRS ST segments and T waves   RADIOLOGY Chest x-ray interpreted by me, appears normal.  Radiology report reviewed.  CT abdomen pelvis unremarkable   PROCEDURES:  Procedures   MEDICATIONS ORDERED IN ED: Medications  sodium chloride 0.9 % bolus 1,000 mL (0 mLs Intravenous Stopped 11/25/22 1334)  ketorolac (TORADOL) 15 MG/ML injection 15 mg (15 mg Intravenous Given 11/25/22 1157)  ondansetron (ZOFRAN) injection 4 mg (4 mg Intravenous Given 11/25/22 1157)  pantoprazole (PROTONIX) injection 40 mg (40 mg Intravenous Given 11/25/22 1158)  iohexol (OMNIPAQUE) 300 MG/ML solution 100 mL (100 mLs Intravenous Contrast Given 11/25/22 1215)     IMPRESSION / MDM / ASSESSMENT AND PLAN / ED COURSE  I reviewed the triage vital signs and the nursing notes.  DDx: Appendicitis, viral illness, GERD/gastritis, dehydration, pancreatitis.  Doubt ACS PE dissection AAA  Patient's presentation is most consistent with acute presentation with potential threat to life or bodily function.  Patient presents with upper abdominal pain, found  to also have right lower quadrant tenderness with symptoms concerning for infectious process.  Most likely viral, will will give IV fluids and supportive care medications while obtaining CT to rule out appendicitis.  ----------------------------------------- 1:08 PM on 11/25/2022 ----------------------------------------- Workup reassuring.  Labs normal, CT abdomen pelvis and chest x-ray unremarkable.  Feeling better, tolerating oral intake, stable for discharge.       FINAL CLINICAL IMPRESSION(S) / ED DIAGNOSES   Final diagnoses:  Left upper quadrant abdominal pain  Influenza-like illness     Rx / DC Orders   ED Discharge Orders          Ordered    sucralfate (CARAFATE) 1 g tablet  4 times daily        11/25/22 1321    famotidine (PEPCID) 20 MG tablet  2 times daily        11/25/22 1321    metoCLOPramide  (REGLAN) 10 MG tablet  Every 6 hours PRN        11/25/22 1321             Note:  This document was prepared using Dragon voice recognition software and may include unintentional dictation errors.   Sharman Cheek, MD 11/25/22 [phone]

## 2022-12-26 ENCOUNTER — Emergency Department: Payer: BC Managed Care – PPO

## 2022-12-26 ENCOUNTER — Encounter: Payer: Self-pay | Admitting: Radiology

## 2022-12-26 ENCOUNTER — Emergency Department
Admission: EM | Admit: 2022-12-26 | Discharge: 2022-12-26 | Disposition: A | Discharge status: Home / Self Care | Payer: BC Managed Care – PPO | Attending: Emergency Medicine | Admitting: Emergency Medicine

## 2022-12-26 DIAGNOSIS — M79602 Pain in left arm: Secondary | ICD-10-CM | POA: Diagnosis not present

## 2022-12-26 DIAGNOSIS — J45909 Unspecified asthma, uncomplicated: Secondary | ICD-10-CM | POA: Diagnosis not present

## 2022-12-26 DIAGNOSIS — J310 Chronic rhinitis: Secondary | ICD-10-CM | POA: Diagnosis not present

## 2022-12-26 DIAGNOSIS — T485X5A Adverse effect of other anti-common-cold drugs, initial encounter: Secondary | ICD-10-CM | POA: Diagnosis not present

## 2022-12-26 DIAGNOSIS — R0981 Nasal congestion: Secondary | ICD-10-CM | POA: Diagnosis present

## 2022-12-26 LAB — CBC
HCT: 42.4 % (ref 39.0–52.0)
Hemoglobin: 14.3 g/dL (ref 13.0–17.0)
MCH: 30.8 pg (ref 26.0–34.0)
MCHC: 33.7 g/dL (ref 30.0–36.0)
MCV: 91.2 fL (ref 80.0–100.0)
Platelets: 200 10*3/uL (ref 150–400)
RBC: 4.65 MIL/uL (ref 4.22–5.81)
RDW: 12.6 % (ref 11.5–15.5)
WBC: 3 10*3/uL — ABNORMAL LOW (ref 4.0–10.5)
nRBC: 0 % (ref 0.0–0.2)

## 2022-12-26 LAB — TROPONIN I (HIGH SENSITIVITY)
Troponin I (High Sensitivity): 3 ng/L (ref <18)
Troponin I (High Sensitivity): 3 ng/L (ref <18)

## 2022-12-26 LAB — BASIC METABOLIC PANEL
Anion gap: 8 (ref 5–15)
BUN: 13 mg/dL (ref 6–20)
CO2: 25 mmol/L (ref 22–32)
Calcium: 9.4 mg/dL (ref 8.9–10.3)
Chloride: 101 mmol/L (ref 98–111)
Creatinine, Ser: 1 mg/dL (ref 0.61–1.24)
GFR, Estimated: >60 mL/min (ref >60)
Glucose, Bld: 92 mg/dL (ref 70–99)
Potassium: 4.3 mmol/L (ref 3.5–5.1)
Sodium: 134 mmol/L — ABNORMAL LOW (ref 135–145)

## 2022-12-26 MED ORDER — ACETAMINOPHEN 500 MG PO TABS
1000.0000 mg | ORAL_TABLET | Freq: Once | ORAL | Status: DC
  Filled 2022-12-26: qty 2

## 2022-12-26 MED ORDER — NAPROXEN 500 MG PO TABS
250.0000 mg | ORAL_TABLET | Freq: Once | ORAL | Status: DC
  Filled 2022-12-26: qty 1

## 2022-12-26 NOTE — ED Provider Notes (Signed)
[facility] Provider Note    Event Date/Time   First MD Initiated Contact with Patient 12/26/22 1507     (approximate)   History   Chest Pain   HPI  AMILLIO MULCAHY is a 57 y.o. male history of asthma, GERD who presents with left arm pain and nasal congestion.  Patient tells me he has had nasal congestion for about 2 weeks.  It is primarily the right side.  He has minimal clear drainage.  Zyrtec about a month ago because he thought it was making his heart rate high.  He has been taking Afrin daily for quite some time.  He denies fevers or chills no cough or sore throat currently.  Denies dyspnea.  Has also had left hand pain for some time starting yesterday it started to migrate up into his wrist forearm and then today started to feel it in the upper arm and biceps region radiating to his chest.  Denies any new neck pain does occasionally have some tingling in the forearm.  Did have some nausea the arm pain was severe.  No chest pain currently.    Past Medical History:  Diagnosis Date   Asthma    Benign prostate hyperplasia    Elevated PSA    GERD (gastroesophageal reflux disease)     There are no problems to display for this patient.    Physical Exam  Triage Vital Signs: ED Triage Vitals  Enc Vitals Group     BP 12/26/22 1344 (!) 155/87     Pulse Rate 12/26/22 1344 (!) 50     Resp 12/26/22 1344 18     Temp 12/26/22 1344 98.1 F (36.7 C)     Temp Source 12/26/22 1344 Oral     SpO2 12/26/22 1344 100 %     Weight 12/26/22 1341 190 lb (86.2 kg)     Height 12/26/22 1341 5\' 10"  (1.778 m)     Head Circumference --      Peak Flow --      Pain Score --      Pain Loc --      Pain Edu? --      Excl. in GC? --     Most recent vital signs: Vitals:   12/26/22 1344 12/26/22 1628  BP: (!) 155/87 128/77  Pulse: (!) 50 62  Resp: 18 16  Temp: 98.1 F (36.7 C)   SpO2: 100% 100%     General: Awake, no distress.  CV:  Good peripheral perfusion.   Resp:  Normal effort.  Abd:  No distention.  Neuro:             Awake, Alert, Oriented x 3  Other:  No midline C-spine tenderness, tenderness to palpation over the scapular spine anterior shoulder biceps tendon upper arm and forearm but no significant tenderness erythema or deformity patient is able to fully range the shoulder does have pain with abduction and internal rotation 5 out of 5 strength with grip bilateral upper extremities elbow flexion extension and finger abduction Subjective decreased sensation over the left forearm compared to right   ED Results / Procedures / Treatments  Labs (all labs ordered are listed, but only abnormal results are displayed) Labs Reviewed  BASIC METABOLIC PANEL - Abnormal; Notable for the following components:      Result Value   Sodium 134 (*)    All other components within normal limits  CBC - Abnormal; Notable for the following components:  WBC 3.0 (*)    All other components within normal limits  TROPONIN I (HIGH SENSITIVITY)  TROPONIN I (HIGH SENSITIVITY)     EKG  EKG reviewed interpreted myself shows sinus bradycardia with normal axis and intervals biphasic T wave in aVF, appears similar to prior EKG on 11/28/2022   RADIOLOGY I reviewed and interpreted the CXR which does not show any acute cardiopulmonary process    PROCEDURES:  Critical Care performed: No  Procedures  The patient is on the cardiac monitor to evaluate for evidence of arrhythmia and/or significant heart rate changes.   MEDICATIONS ORDERED IN ED: Medications  naproxen (NAPROSYN) tablet 250 mg (250 mg Oral Not Given 12/26/22 1608)  acetaminophen (TYLENOL) tablet 1,000 mg (1,000 mg Oral Not Given 12/26/22 1608)     IMPRESSION / MDM / ASSESSMENT AND PLAN / ED COURSE  I reviewed the triage vital signs and the nursing notes.                              Patient's presentation is most consistent with acute complicated illness / injury requiring diagnostic  workup.  Differential diagnosis includes, but is not limited to, referred pain from rotator cuff pathology, cervical radiculopathy, carpal tunnel syndrome, biceps tendinitis, impingement syndrome, low suspicion for ACS; medication side effect, bacterial sinusitis, allergic rhinitis  The patient is a 57 year old male who presents both with left arm pain and with sinus congestion.  Tells me he has had sinus congestion for about 2 weeks.  He recently self DC'd his cetirizine about 4 weeks ago because he thought it was making his heart rate high.  He has been using Nasacort and notably has been using Afrin daily for quite some time.  Feels like the right nare is more congested and he has some pressure on his face on that side.  Denies any purulent nasal discharge no fevers chills or headache.  I suspect patient's congestion is enlarged related to the fact that he has been using the oxymetazoline daily, he has rhinitis medicamentosa.  Explained to patient that Afrin can only be used for maximum of 3 days at once due to rebound side effects.  I recommend he does restart the Zyrtec does I do think there is a large allergic component to this.  Given no purulent discharge or fever do not feel that he needs antibiotics at this time.  Patient's secondary complaint is left arm pain.  Tells me he has had some hand pain for several weeks but since yesterday it has been starting to radiate up into the arm involves the forearm biceps area and into the scapula.  Was radiating to the chest today earlier.  It is worse when he massages the arm and he does have some numbness over his hand and fingers as it involves all 5 fingers.  On exam there is no obvious abnormality including swelling or deformity.  He has a good radial pulse.  He does have some tenderness over the scapular spine as well as positive impingement testing with pain reproduced by abduction and internal rotation of the shoulder.  He also has some tenderness over  the biceps tendon and most of the upper and lower arm.  He is able to range shoulder elbow and wrist without difficulty and he has 5 out of 5 strength with grip elbow flexion extension and finger abduction.  Does have subjective decrease sensation over the forearm.  Unclear exactly what the  source of his pain is but I do suspect it is musculoskeletal possibly cervical radiculopathy versus referred pain from the shoulder or biceps tendon.  My suspicion for ACS the cause of the symptoms is quite low especially given pain is reproducible with movement and palpation.  An EKG and serial troponins were obtained and these are also reassuring.  Recommended Tylenol NSAIDs and follow-up with PCP as he may need physical therapy.       FINAL CLINICAL IMPRESSION(S) / ED DIAGNOSES   Final diagnoses:  Rhinitis medicamentosa  Pain of left upper extremity     Rx / DC Orders   ED Discharge Orders     None        Note:  This document was prepared using Dragon voice recognition software and may include unintentional dictation errors.   Georga Hacking, MD 12/26/22 1901

## 2022-12-26 NOTE — Discharge Instructions (Addendum)
I suspect that your congestion is related to allergies.  Please stop taking the Afrin as taking this every day is likely contributing to your congestion.  You can continue the Nasacort and I do recommend starting the Zyrtec again.  Please follow-up with a primary care doctor.  Below are some options.  Take Tylenol and ibuprofen for your arm pain.  Avoid activities that are exacerbating it.  You may benefit from physical therapy.  Please go to the following website to schedule new (and existing) patient appointments:   [email or web address]   The following is a list of primary care offices in the area who are accepting new patients at this time.  Please reach out to one of them directly and let them know you would like to schedule an appointment to follow up on an Emergency Department visit, and/or to establish a new primary care provider (PCP).  There are likely other primary care clinics in the are who are accepting new patients, but this is an excellent place to start:  [facility] Lead physician: Dr Shirlee Latch [address] [place], [place] [address] [phone]  [facility] Lead Physician: Dr Alba Cory [address], [place], [place] [address] [phone]  [facility]  Lead Physician: Dr Olevia Perches [address] [place], [place] [address] [phone]  [facility] Lead Physician: Dr Sofie Hartigan [address], [place], [place] [address] [phone]  [facility] Lead Physician: Dr Bari Edward [address] [place], [place], [place] [address] [phone]

## 2022-12-26 NOTE — ED Triage Notes (Signed)
Pt states his left arm leading into his left chest is hurting. Pt states it began hurting on Saturday but has just gotten worse. Pt endorses left hand pain for the past two weeks that then traveled to the arm and now into the chest. Pt also endorses nausea that started a few hours ago.

## 2023-01-02 ENCOUNTER — Emergency Department: Payer: BC Managed Care – PPO

## 2023-01-02 ENCOUNTER — Other Ambulatory Visit: Payer: Self-pay

## 2023-01-02 ENCOUNTER — Emergency Department
Admission: EM | Admit: 2023-01-02 | Discharge: 2023-01-02 | Disposition: A | Discharge status: Home / Self Care | Payer: BC Managed Care – PPO | Attending: Emergency Medicine | Admitting: Emergency Medicine

## 2023-01-02 ENCOUNTER — Encounter: Payer: Self-pay | Admitting: Emergency Medicine

## 2023-01-02 DIAGNOSIS — R200 Anesthesia of skin: Secondary | ICD-10-CM | POA: Diagnosis present

## 2023-01-02 DIAGNOSIS — J45909 Unspecified asthma, uncomplicated: Secondary | ICD-10-CM | POA: Insufficient documentation

## 2023-01-02 LAB — COMPREHENSIVE METABOLIC PANEL
ALT: 18 U/L (ref 0–44)
AST: 19 U/L (ref 15–41)
Albumin: 4.6 g/dL (ref 3.5–5.0)
Alkaline Phosphatase: 51 U/L (ref 38–126)
Anion gap: 7 (ref 5–15)
BUN: 12 mg/dL (ref 6–20)
CO2: 26 mmol/L (ref 22–32)
Calcium: 9.8 mg/dL (ref 8.9–10.3)
Chloride: 104 mmol/L (ref 98–111)
Creatinine, Ser: 1.04 mg/dL (ref 0.61–1.24)
GFR, Estimated: >60 mL/min (ref >60)
Glucose, Bld: 96 mg/dL (ref 70–99)
Potassium: 3.8 mmol/L (ref 3.5–5.1)
Sodium: 137 mmol/L (ref 135–145)
Total Bilirubin: 0.8 mg/dL (ref 0.3–1.2)
Total Protein: 8.8 g/dL — ABNORMAL HIGH (ref 6.5–8.1)

## 2023-01-02 LAB — URINALYSIS, ROUTINE W REFLEX MICROSCOPIC
Bilirubin Urine: NEGATIVE
Glucose, UA: NEGATIVE mg/dL
Hgb urine dipstick: NEGATIVE
Ketones, ur: NEGATIVE mg/dL
Leukocytes,Ua: NEGATIVE
Nitrite: NEGATIVE
Protein, ur: NEGATIVE mg/dL
Specific Gravity, Urine: 1.021 (ref 1.005–1.030)
pH: 5 (ref 5.0–8.0)

## 2023-01-02 LAB — CBC WITH DIFFERENTIAL/PLATELET
Abs Immature Granulocytes: 0 10*3/uL (ref 0.00–0.07)
Basophils Absolute: 0 10*3/uL (ref 0.0–0.1)
Basophils Relative: 0 %
Eosinophils Absolute: 0 10*3/uL (ref 0.0–0.5)
Eosinophils Relative: 1 %
HCT: 44.2 % (ref 39.0–52.0)
Hemoglobin: 15.1 g/dL (ref 13.0–17.0)
Immature Granulocytes: 0 %
Lymphocytes Relative: 38 %
Lymphs Abs: 1.3 10*3/uL (ref 0.7–4.0)
MCH: 30.9 pg (ref 26.0–34.0)
MCHC: 34.2 g/dL (ref 30.0–36.0)
MCV: 90.6 fL (ref 80.0–100.0)
Monocytes Absolute: 0.4 10*3/uL (ref 0.1–1.0)
Monocytes Relative: 11 %
Neutro Abs: 1.8 10*3/uL (ref 1.7–7.7)
Neutrophils Relative %: 50 %
Platelets: 216 10*3/uL (ref 150–400)
RBC: 4.88 MIL/uL (ref 4.22–5.81)
RDW: 12.5 % (ref 11.5–15.5)
WBC: 3.5 10*3/uL — ABNORMAL LOW (ref 4.0–10.5)
nRBC: 0 % (ref 0.0–0.2)

## 2023-01-02 LAB — CK: Total CK: 172 U/L (ref 49–397)

## 2023-01-02 LAB — TROPONIN I (HIGH SENSITIVITY): Troponin I (High Sensitivity): 4 ng/L (ref <18)

## 2023-01-02 MED ORDER — SODIUM CHLORIDE 0.9 % IV BOLUS
1000.0000 mL | Freq: Once | INTRAVENOUS | Status: AC
  Administered 2023-01-02: 1000 mL via INTRAVENOUS

## 2023-01-02 NOTE — ED Triage Notes (Addendum)
States told to get a mono test. Gary Nash for mono test at [facility]. Pt did not get test for mono as [facility] staff sent pt over in concern for TIA/stroke. Pt A&Ox4 and speech clear. Pt able to move all extremities appropriately currently.

## 2023-01-02 NOTE — ED Notes (Signed)
See triage note  Presents with some numbness to right side of face and right arm  States is started on Friday  Then was seen at [facility] for same and d/c'd  States he is still having the numbness to his face but not as bad  Neuro intact

## 2023-01-02 NOTE — ED Notes (Signed)
EKG to EDP Goodman in person.  

## 2023-01-02 NOTE — ED Triage Notes (Signed)
Pt reports was at work in heat and went inside to [facility] bc he wasn't feeling well; states R side of head, neck and arm started to experience numbness along with pain, a HA and nausea. Pt states drank water and electrolytes. Pt went to be seen for similar symptoms multiple times at different facilities [facility] and [facility]) but many times left before being seen by a provider.

## 2023-01-02 NOTE — ED Provider Notes (Signed)
[facility] Provider Note  Patient Contact: 3:25 PM (approximate)   History   No chief complaint on file.   HPI  Gary Nash is a 57 y.o. male with a history of GERD and asthma, presents to the emergency department with very mild lingering right-sided facial numbness.  Patient reports that he has had similar symptoms in the past that is occurred intermittently and has sought care multiple times for in the past.  Patient states that symptoms started with a right-sided facial numbness that is restricted to the forehead and has been associated with pain.  Patient states that he has no associated nausea or vomiting but sometimes has some associated chest pain and shortness of breath with the symptoms.  He states that sometimes he shakes and a provider that he saw at some point in the recent past suggested anxiety.  Patient denies falls or mechanisms of trauma and denies weakness in the upper and lower extremities.  He is not dizzy with ambulation.  He states that symptoms seem to be provoked by the heat. No nausea, vomiting, abdominal pain or fever.       Physical Exam   Triage Vital Signs: ED Triage Vitals  Enc Vitals Group     BP 01/02/23 1335 136/80     Pulse Rate 01/02/23 1335 61     Resp 01/02/23 1335 17     Temp 01/02/23 1335 97.8 F (36.6 C)     Temp Source 01/02/23 1335 Oral     SpO2 01/02/23 1335 100 %     Weight 01/02/23 1341 189 lb 9.5 oz (86 kg)     Height 01/02/23 1452 5\' 10"  (1.778 m)     Head Circumference --      Peak Flow --      Pain Score 01/02/23 1340 3     Pain Loc --      Pain Edu? --      Excl. in GC? --     Most recent vital signs: Vitals:   01/02/23 1335  BP: 136/80  Pulse: 61  Resp: 17  Temp: 97.8 F (36.6 C)  SpO2: 100%     General: Alert and in no acute distress. Eyes:  PERRL. EOMI. Head: No acute traumatic findings ENT:      Nose: No congestion/rhinnorhea.      Mouth/Throat: Mucous membranes are moist.   Neck: No stridor. No cervical spine tenderness to palpation. Cardiovascular:  Good peripheral perfusion Respiratory: Normal respiratory effort without tachypnea or retractions. Lungs CTAB. Good air entry to the bases with no decreased or absent breath sounds. Gastrointestinal: Bowel sounds 4 quadrants. Soft and nontender to palpation. No guarding or rigidity. No palpable masses. No distention. No CVA tenderness. Musculoskeletal: Patient has symmetric strength in the upper and lower extremities.  Full range of motion to all extremities.  Neurologic: Light and deep sensation intact.  No gross focal neurologic deficits are appreciated.  Skin:   No rash noted   ED Results / Procedures / Treatments   Labs (all labs ordered are listed, but only abnormal results are displayed) Labs Reviewed  CBC WITH DIFFERENTIAL/PLATELET - Abnormal; Notable for the following components:      Result Value   WBC 3.5 (*)    All other components within normal limits  COMPREHENSIVE METABOLIC PANEL - Abnormal; Notable for the following components:   Total Protein 8.8 (*)    All other components within normal limits  URINALYSIS, ROUTINE W REFLEX MICROSCOPIC -  Abnormal; Notable for the following components:   Color, Urine YELLOW (*)    APPearance HAZY (*)    All other components within normal limits  CK  TROPONIN I (HIGH SENSITIVITY)  TROPONIN I (HIGH SENSITIVITY)     EKG  Sinus bradycardia without ST segment elevation or other apparent arrhythmias   RADIOLOGY  I personally viewed and evaluated these images as part of my medical decision making, as well as reviewing the written report by the radiologist.  ED Provider Interpretation: CT head shows no acute abnormality.   PROCEDURES:  Critical Care performed: No  Procedures   MEDICATIONS ORDERED IN ED: Medications  sodium chloride 0.9 % bolus 1,000 mL (1,000 mLs Intravenous New Bag/Given 01/02/23 1623)     IMPRESSION / MDM / ASSESSMENT AND  PLAN / ED COURSE  I reviewed the triage vital signs and the nursing notes.                              Assessment and plan: Facial numbness:  57 year old male presents to the emergency department with concern for a right-sided headache with some facial numbness that started on Friday.  Vital signs are reassuring at triage.  On exam, patient was alert and nontoxic-appearing and had no perceived neurodeficits.  Sensation along the right forehead was intact to both light and deep touch.  I reviewed the EMR and see where seen the patient has been evaluated for similar complaints multiple times in the past.  CBC with slight lymphopenia but was otherwise reassuring.  CMP was within reference range.  Troponin and CK within range.  Patient reported that his headache had resolved and he was overall feeling well.  Do not feel that additional imaging is warranted at this time as patient has had MRI brain in 2019 with reassuring results.  I discussed patient case with attending, Dr. Lenard Lance and we agreed to hold off on MRI brain at this time.  Patient feels quite comfortable with this plan and requested work note at discharge.  FINAL CLINICAL IMPRESSION(S) / ED DIAGNOSES   Final diagnoses:  Facial numbness     Rx / DC Orders   ED Discharge Orders     None        Note:  This document was prepared using Dragon voice recognition software and may include unintentional dictation errors.   Pia Mau [place], PA-C 01/02/23 1729    Minna Antis, MD 01/02/23 2251

## 2023-02-21 ENCOUNTER — Encounter: Payer: Self-pay | Admitting: Nurse Practitioner

## 2023-02-21 ENCOUNTER — Other Ambulatory Visit: Payer: Self-pay

## 2023-02-21 ENCOUNTER — Ambulatory Visit: Payer: BC Managed Care – PPO | Admitting: Nurse Practitioner

## 2023-02-21 VITALS — BP 124/74 | HR 66 | Temp 97.9°F | Resp 16 | Wt 180.3 lb

## 2023-02-21 DIAGNOSIS — Z125 Encounter for screening for malignant neoplasm of prostate: Secondary | ICD-10-CM

## 2023-02-21 DIAGNOSIS — Z113 Encounter for screening for infections with a predominantly sexual mode of transmission: Secondary | ICD-10-CM

## 2023-02-21 DIAGNOSIS — Z13220 Encounter for screening for lipoid disorders: Secondary | ICD-10-CM

## 2023-02-21 DIAGNOSIS — Z130 Encounter for screening for diseases of the blood and blood-forming organs and certain disorders involving the immune mechanism: Secondary | ICD-10-CM

## 2023-02-21 DIAGNOSIS — Z114 Encounter for screening for human immunodeficiency virus [HIV]: Secondary | ICD-10-CM

## 2023-02-21 DIAGNOSIS — R030 Elevated blood-pressure reading, without diagnosis of hypertension: Secondary | ICD-10-CM

## 2023-02-21 DIAGNOSIS — G8929 Other chronic pain: Secondary | ICD-10-CM

## 2023-02-21 DIAGNOSIS — K219 Gastro-esophageal reflux disease without esophagitis: Secondary | ICD-10-CM | POA: Diagnosis not present

## 2023-02-21 DIAGNOSIS — M5441 Lumbago with sciatica, right side: Secondary | ICD-10-CM

## 2023-02-21 DIAGNOSIS — Z7689 Persons encountering health services in other specified circumstances: Secondary | ICD-10-CM

## 2023-02-21 DIAGNOSIS — R1319 Other dysphagia: Secondary | ICD-10-CM

## 2023-02-21 DIAGNOSIS — Z131 Encounter for screening for diabetes mellitus: Secondary | ICD-10-CM

## 2023-02-21 DIAGNOSIS — J309 Allergic rhinitis, unspecified: Secondary | ICD-10-CM

## 2023-02-21 DIAGNOSIS — D72819 Decreased white blood cell count, unspecified: Secondary | ICD-10-CM

## 2023-02-21 DIAGNOSIS — J4520 Mild intermittent asthma, uncomplicated: Secondary | ICD-10-CM | POA: Insufficient documentation

## 2023-02-21 DIAGNOSIS — J4540 Moderate persistent asthma, uncomplicated: Secondary | ICD-10-CM

## 2023-02-21 DIAGNOSIS — Z1159 Encounter for screening for other viral diseases: Secondary | ICD-10-CM

## 2023-02-21 DIAGNOSIS — Z1211 Encounter for screening for malignant neoplasm of colon: Secondary | ICD-10-CM

## 2023-02-21 DIAGNOSIS — F419 Anxiety disorder, unspecified: Secondary | ICD-10-CM

## 2023-02-21 LAB — CBC WITH DIFFERENTIAL/PLATELET
Absolute Monocytes: 450 cells/uL (ref 200–950)
Basophils Absolute: 9 cells/uL (ref 0–200)
Basophils Relative: 0.3 %
Eosinophils Absolute: 31 cells/uL (ref 15–500)
Eosinophils Relative: 1 %
HCT: 40.8 % (ref 38.5–50.0)
Hemoglobin: 14 g/dL (ref 13.2–17.1)
Lymphs Abs: 1606 cells/uL (ref 850–3900)
MCH: 31.1 pg (ref 27.0–33.0)
MCHC: 34.3 g/dL (ref 32.0–36.0)
MCV: 90.7 fL (ref 80.0–100.0)
MPV: 9.6 fL (ref 7.5–12.5)
Monocytes Relative: 14.5 %
Neutro Abs: 1004 cells/uL — ABNORMAL LOW (ref 1500–7800)
Neutrophils Relative %: 32.4 %
Platelets: 210 10*3/uL (ref 140–400)
RBC: 4.5 10*6/uL (ref 4.20–5.80)
RDW: 12.5 % (ref 11.0–15.0)
Total Lymphocyte: 51.8 %
WBC: 3.1 10*3/uL — ABNORMAL LOW (ref 3.8–10.8)

## 2023-02-21 MED ORDER — FAMOTIDINE 20 MG PO TABS
20.0000 mg | ORAL_TABLET | Freq: Two times a day (BID) | ORAL | 1 refills | Status: DC
Start: 2023-02-21 — End: 2023-04-03

## 2023-02-21 MED ORDER — AIRSUPRA 90-80 MCG/ACT IN AERO
1.0000 | INHALATION_SPRAY | RESPIRATORY_TRACT | 5 refills | Status: DC | PRN
Start: 2023-02-21 — End: 2023-09-12

## 2023-02-21 MED ORDER — METHOCARBAMOL 500 MG PO TABS
500.0000 mg | ORAL_TABLET | Freq: Two times a day (BID) | ORAL | 0 refills | Status: DC | PRN
Start: 2023-02-21 — End: 2023-09-12

## 2023-02-21 MED ORDER — OMEPRAZOLE 20 MG PO CPDR
20.0000 mg | DELAYED_RELEASE_CAPSULE | Freq: Every day | ORAL | 1 refills | Status: DC
Start: 2023-02-21 — End: 2023-08-29

## 2023-02-21 MED ORDER — NAPROXEN 500 MG PO TABS
500.0000 mg | ORAL_TABLET | Freq: Two times a day (BID) | ORAL | 0 refills | Status: AC
Start: 2023-02-21 — End: 2023-03-03

## 2023-02-21 NOTE — Assessment & Plan Note (Signed)
Getting labs 

## 2023-02-21 NOTE — Assessment & Plan Note (Signed)
Referral placed to gi.  Start omeprazole , continue pepcid BID

## 2023-02-21 NOTE — Progress Notes (Signed)
BP 124/74   Pulse 66   Temp 97.9 F (36.6 C) (Oral)   Resp 16   Wt 180 lb 4.8 oz (81.8 kg)   SpO2 98%   BMI 25.87 kg/m    Subjective:    Patient ID: Gary Nash, male    DOB: 1965-09-05, 57 y.o.   MRN: [id number]  HPI: Gary Nash is a 57 y.o. male  Chief Complaint  Patient presents with   Establish Care   Gastroesophageal Reflux   Sciatica    Right side    Prostate Check   Allergic Rhinitis    Hypertension   Establish care: his last physical was years ago.  Medical history includes listed below.    Health Maintenance due for labs, colon cancer screening, prostate screening.   Leukocytopenia: he reports it has always been low, he has never been seen by hematology.      Latest Ref Rng & Units 01/02/2023    1:49 PM 12/26/2022    1:49 PM 11/25/2022   10:18 AM  CBC  WBC 4.0 - 10.5 K/uL 3.5  3.0  3.6   Hemoglobin 13.0 - 17.0 g/dL 78.2  95.6  21.3   Hematocrit 39.0 - 52.0 % 44.2  42.4  39.2   Platelets 150 - 400 K/uL 216  200  190     Elevated blood pressure: - patient reports that the last few times he was at urgent care his blood pressure was elevated. 157/ something -Medications: none, he reports he has been taking beet root and he feels like this has helped his pressure -Denies any SOB, CP, vision changes, LE edema or symptoms of hypotension      02/21/2023    9:35 AM 01/02/2023    5:35 PM 01/02/2023    2:52 PM  Vitals with BMI  Height   5\' 10"   Weight 180 lbs 5 oz  189 lbs 10 oz  BMI   27.2  Systolic 124 128   Diastolic 74 78   Pulse 66 60     Asthma/allergic rhinitis:   - he is currently taking Nasacort for allergic rhinitis  -Asthma status: uncontrolled -Current Treatments: albuterol -Satisfied with current treatment?: no -Albuterol/rescue inhaler frequency: a couple times a week -Dyspnea frequency: a couple times a week -Wheezing frequency: a couple times a week -Cough frequency: none -Nocturnal symptom frequency:  -Limitation of activity:  no -Current upper respiratory symptoms: no -Triggers: scents -Home peak flows:no -Last Spirometry: no -Failed/intolerant to following asthma meds: has only had albuterol -Asthma meds in past:  -Aerochamber/spacer use: no -Visits to ER or Urgent Care in past year: yes -Pneumovax: Not up to Date -Influenza: Not up to Date  - will start air supra  GERD GERD control status: uncontrolledSatisfied with current treatment? yes Heartburn frequency: daily Medication side effects: no  Medication compliance: stable Previous GERD medications: gas x, prevacid,  over the counter treatments.   Dysphagia: yes Odynophagia:  no Hematemesis: no Blood in stool: no EGD: yes - will refer to GI, start omeprazole, continue pepcid  anxiety Medication previously on zoloft, he says it made him feel bad.  Not currently taking zoloft.  He reports he is doing better since starting beet root. He does not want to take anxiety medication at this time.  PHQ9 negative GAD positive     02/21/2023    9:43 AM  GAD 7 : Generalized Anxiety Score  Nervous, Anxious, on Edge 3  Control/stop worrying 3  Worry too  much - different things 3  Trouble relaxing 3  Restless 3  Easily annoyed or irritable 1  Afraid - awful might happen 3  Total GAD 7 Score 19  Anxiety Difficulty Somewhat difficult      02/21/2023    9:43 AM  Depression screen PHQ 2/9  Decreased Interest 0  Down, Depressed, Hopeless 0  PHQ - 2 Score 0  Altered sleeping 0  Tired, decreased energy 1  Change in appetite 1  Feeling bad or failure about yourself  0  Trouble concentrating 0  Moving slowly or fidgety/restless 0  Suicidal thoughts 0  PHQ-9 Score 2  Difficult doing work/chores Not difficult at all    Low back pain radiating down right leg: he reports that he gets a pain from his right hip down his leg. Reports he has had it for years but has recently gotten worse.  He reports he has tried medications before but does not remember what  it was.  He has never done physical therapy.  He says that even sitting down he has to lean to the left.  No incontinence, no loss of function.  Will start naproxen, robaxin for pain. Will refer for physical therapy.     Relevant past medical, surgical, family and social history reviewed and updated as indicated. Interim medical history since our last visit reviewed. Allergies and medications reviewed and updated.  Review of Systems  Constitutional: Negative for fever or weight change.  Respiratory: Negative for cough and shortness of breath.   Cardiovascular: Negative for chest pain or palpitations.  Gastrointestinal: Negative for abdominal pain, no bowel changes.  Musculoskeletal: Negative for gait problem or joint swelling. Positive for right side low back pain radiating down right leg Skin: Negative for rash.  Neurological: Negative for dizziness or headache.  No other specific complaints in a complete review of systems (except as listed in HPI above).      Objective:    BP 124/74   Pulse 66   Temp 97.9 F (36.6 C) (Oral)   Resp 16   Wt 180 lb 4.8 oz (81.8 kg)   SpO2 98%   BMI 25.87 kg/m   Wt Readings from Last 3 Encounters:  02/21/23 180 lb 4.8 oz (81.8 kg)  01/02/23 189 lb 9.5 oz (86 kg)  12/26/22 190 lb (86.2 kg)    Physical Exam  Constitutional: Patient appears well-developed and well-nourished.  No distress.  HEENT: head atraumatic, normocephalic, pupils equal and reactive to light, neck supple Cardiovascular: Normal rate, regular rhythm and normal heart sounds.  No murmur heard. No BLE edema. Pulmonary/Chest: Effort normal and breath sounds normal. No respiratory distress. Abdominal: Soft.  There is no tenderness. MSK: tenderness in the right lower back, positive straight leg test Psychiatric: Patient has a normal mood and affect. behavior is normal. Judgment and thought content normal.   Results for orders placed or performed during the hospital encounter of  01/02/23  CBC with Differential  Result Value Ref Range   WBC 3.5 (L) 4.0 - 10.5 K/uL   RBC 4.88 4.22 - 5.81 MIL/uL   Hemoglobin 15.1 13.0 - 17.0 g/dL   HCT 78.2 95.6 - 21.3 %   MCV 90.6 80.0 - 100.0 fL   MCH 30.9 26.0 - 34.0 pg   MCHC 34.2 30.0 - 36.0 g/dL   RDW 08.6 57.8 - 46.9 %   Platelets 216 150 - 400 K/uL   nRBC 0.0 0.0 - 0.2 %   Neutrophils Relative % 50 %  Neutro Abs 1.8 1.7 - 7.7 K/uL   Lymphocytes Relative 38 %   Lymphs Abs 1.3 0.7 - 4.0 K/uL   Monocytes Relative 11 %   Monocytes Absolute 0.4 0.1 - 1.0 K/uL   Eosinophils Relative 1 %   Eosinophils Absolute 0.0 0.0 - 0.5 K/uL   Basophils Relative 0 %   Basophils Absolute 0.0 0.0 - 0.1 K/uL   Immature Granulocytes 0 %   Abs Immature Granulocytes 0.00 0.00 - 0.07 K/uL  Comprehensive metabolic panel  Result Value Ref Range   Sodium 137 135 - 145 mmol/L   Potassium 3.8 3.5 - 5.1 mmol/L   Chloride 104 98 - 111 mmol/L   CO2 26 22 - 32 mmol/L   Glucose, Bld 96 70 - 99 mg/dL   BUN 12 6 - 20 mg/dL   Creatinine, Ser 6.64 0.61 - 1.24 mg/dL   Calcium 9.8 8.9 - 40.3 mg/dL   Total Protein 8.8 (H) 6.5 - 8.1 g/dL   Albumin 4.6 3.5 - 5.0 g/dL   AST 19 15 - 41 U/L   ALT 18 0 - 44 U/L   Alkaline Phosphatase 51 38 - 126 U/L   Total Bilirubin 0.8 0.3 - 1.2 mg/dL   GFR, Estimated >47 >42 mL/min   Anion gap 7 5 - 15  CK  Result Value Ref Range   Total CK 172 49 - 397 U/L  Urinalysis, Routine w reflex microscopic -Urine, Clean Catch  Result Value Ref Range   Color, Urine YELLOW (A) YELLOW   APPearance HAZY (A) CLEAR   Specific Gravity, Urine 1.021 1.005 - 1.030   pH 5.0 5.0 - 8.0   Glucose, UA NEGATIVE NEGATIVE mg/dL   Hgb urine dipstick NEGATIVE NEGATIVE   Bilirubin Urine NEGATIVE NEGATIVE   Ketones, ur NEGATIVE NEGATIVE mg/dL   Protein, ur NEGATIVE NEGATIVE mg/dL   Nitrite NEGATIVE NEGATIVE   Leukocytes,Ua NEGATIVE NEGATIVE  Troponin I (High Sensitivity)  Result Value Ref Range   Troponin I (High Sensitivity) 4 <18  ng/L      Assessment & Plan:   Problem List Items Addressed This Visit       Respiratory   Mild intermittent asthma without complication    Start airsupra, additionally take 1-2 puffs 20-30 minutes prior to physical activity      Relevant Medications   Albuterol-Budesonide (AIRSUPRA) 90-80 MCG/ACT AERO   Allergic rhinitis    Continue Nasacort         Digestive   Gastroesophageal reflux disease without esophagitis    Referral placed to gi.  Start omeprazole , continue pepcid BID      Relevant Medications   omeprazole (PRILOSEC) 20 MG capsule   famotidine (PEPCID) 20 MG tablet   Other Relevant Orders   Ambulatory referral to Gastroenterology     Other   Leukocytopenia - Primary (Chronic)    Getting labs      Relevant Orders   CBC with Differential/Platelet   Anxiety    Patient declines medication at this time. He says the beet root he is taking is helping.       Other Visit Diagnoses     Encounter to establish care       Screening for diabetes mellitus       Relevant Orders   COMPLETE METABOLIC PANEL WITH GFR   Hemoglobin A1c   Screening for deficiency anemia       Relevant Orders   CBC with Differential/Platelet   Screening for cholesterol level  Relevant Orders   Lipid panel   Encounter for hepatitis C screening test for low risk patient       Relevant Orders   Hepatitis C antibody   Screening for HIV without presence of risk factors       Relevant Orders   HIV Antibody (routine testing w rflx)   Screening for colon cancer       Screening for prostate cancer       Relevant Orders   PSA   Elevated blood pressure reading       blood pressure in normal range today.   Screening for STD (sexually transmitted disease)       Relevant Orders   RPR   Esophageal dysphagia       referral placed to gi   Relevant Orders   Ambulatory referral to Gastroenterology   Chronic right-sided low back pain with right-sided sciatica       start robaxin,  naproxen and referral placed to physical therapy   Relevant Medications   naproxen (NAPROSYN) 500 MG tablet   methocarbamol (ROBAXIN) 500 MG tablet   Other Relevant Orders   Ambulatory referral to Physical Therapy        Follow up plan: Return in about 6 months (around 08/24/2023) for follow up.

## 2023-02-21 NOTE — Assessment & Plan Note (Signed)
Patient declines medication at this time. He says the beet root he is taking is helping.

## 2023-02-21 NOTE — Assessment & Plan Note (Signed)
Continue Nasacort 

## 2023-02-21 NOTE — Assessment & Plan Note (Signed)
Gary Nash, additionally take 1-2 puffs 20-30 minutes prior to physical activity

## 2023-03-06 ENCOUNTER — Telehealth: Payer: Self-pay

## 2023-03-06 ENCOUNTER — Ambulatory Visit: Payer: BC Managed Care – PPO

## 2023-03-06 NOTE — Telephone Encounter (Signed)
Patient called in to schedule his procedure. The best time to call is between 8:00 am to 12:00 pm and his procedure can be on any day.

## 2023-03-07 NOTE — Progress Notes (Signed)
Gary Nash [address]  [address]  [place], [place] [address]  Main: [phone]  Fax: [phone]   Gastroenterology Consultation  Referring Provider:     Berniece Salines, FNP Primary Care Physician:  Gary Salines, FNP Primary Gastroenterologist:  Gary Nash / Dr. Midge Nash   Reason for Consultation:     GERD, dysphagia        HPI:   Gary Nash is a 57 y.o. y/o male referred for consultation & management  by Gary Salines, FNP.    He is here to evaluate GERD and dysphagia.  Patient states he has history of GERD for over 10 years.  He remembers having an EGD in [place] 10 to 12 years ago, results unavailable.  Since then he has taken OTC meds sporadically as needed for acid reflux.  Has taken Tums, Pepcid, and Pepto-Bismol.  He reports having increased acid reflux with burning in his chest for 1 month.  Symptoms have been severe for the past week.  He has odynophagia and difficulty swallowing solid foods.  He is avoiding meat which feels like it is getting stuck in his chest.  Feels like his throat and esophagus are constricted.  Has sore throat with burning in his throat.  He took doxycycline antibiotic 2 to 3 weeks ago after a tick bite.  He has had 10 pound weight loss in the past month due to upper GI symptoms.  He denies alcohol or tobacco use.  Denies family history of esophageal or stomach cancer.  He was just started on omeprazole 20 Mg once daily with little benefit.  Has also started OTC Pepcid.  He is aged 63 and has never had a colonoscopy.  He denies any lower GI symptoms.  No family history of colon cancer.  Labs 12/31/2022 showed normal CBC with hemoglobin 13.8.  Normal CMP.  Abdominal pelvic CT with contrast 11/25/2022 (to evaluate RLQ pain) showed no acute abnormality.  Past Medical History:  Diagnosis Date   Asthma    Benign prostate hyperplasia    Elevated PSA    GERD (gastroesophageal reflux disease)     Past Surgical  History:  Procedure Laterality Date   NO PAST SURGERIES      Prior to Admission medications   Medication Sig Start Date End Date Taking? Authorizing Provider  Albuterol-Budesonide (AIRSUPRA) 90-80 MCG/ACT AERO Inhale 1-2 puffs into the lungs as needed. 02/21/23   Gary Salines, FNP  Ascorbic Acid (VITAMIN C) 1000 MG tablet Take 1 tablet daily by mouth.    [provider]  cholecalciferol (VITAMIN D) 400 units TABS tablet Take 400 Units by mouth.    [provider]  famotidine (PEPCID) 20 MG tablet Take 1 tablet (20 mg total) by mouth 2 (two) times daily. 02/21/23   Gary Salines, FNP  methocarbamol (ROBAXIN) 500 MG tablet Take 1 tablet (500 mg total) by mouth 2 (two) times daily as needed for muscle spasms. 02/21/23   Gary Salines, FNP  Misc Natural Products (BEET ROOT PO) Take by mouth.    [provider]  omeprazole (PRILOSEC) 20 MG capsule Take 1 capsule (20 mg total) by mouth daily. 02/21/23   Gary Salines, FNP    Family History  Problem Relation Age of Onset   Healthy Mother    Hypertension Father    Erectile dysfunction Father    Diabetes Father    Hypertension Brother      Social  History   Tobacco Use   Smoking status: Never   Smokeless tobacco: Never  Vaping Use   Vaping status: Never Used  Substance Use Topics   Alcohol use: Not Currently    Comment: Occasional   Drug use: No    Allergies as of 03/08/2023 - Review Complete 03/08/2023  Allergen Reaction Noted   Azithromycin Nausea Only 09/04/2018    Review of Systems:    All systems reviewed and negative except where noted in HPI.   Physical Exam:  BP 121/79 (BP Location: Left Arm, Patient Position: Sitting, Cuff Size: Normal)   Pulse (!) 57   Temp 98.6 F (37 C) (Oral)   Ht 5\' 10"  (1.778 m)   Wt 174 lb (78.9 kg)   BMI 24.97 kg/m  No LMP for male patient.  General:   Alert,  Well-developed, well-nourished, pleasant and cooperative in NAD Mouth: No oral lesions, ulcers,  redness or signs of yeast. Neck:  Mild tenderness on the right neck.  No enlarged masses or lymph nodes.  No thyromegaly. Lungs:  Respirations even and unlabored.  Clear throughout to auscultation.   No wheezes, crackles, or rhonchi. No acute distress. Heart:  Regular rate and rhythm; no murmurs, clicks, rubs, or gallops. Abdomen:  Normal bowel sounds.  No bruits.  Soft, and non-distended without masses, hepatosplenomegaly or hernias noted.  No Tenderness.  No guarding or rebound tenderness.     Imaging Studies: See HPI.  Assessment and Plan:   Gary Nash is a 57 y.o. y/o male has been referred for severe GERD symptoms, odynophagia and Dysphagia.  Differential includes GERD with esophagitis, esophageal stricture, EOE, candidiasis.  Took doxycycline 2 to 3 weeks ago.  Could have pill esophagitis.  History of GERD for over 10 years.  Sporadically takes OTC meds as needed.  1.  GERD  Scheduling EGD I discussed risks of EGD with patient to include risk of bleeding, perforation, and risk of sedation.  Patient expressed understanding and agrees to proceed with EGD.   Take omeprazole 20 Mg once daily before breakfast.  Increase to 2 tablets daily if needed.  Take Pepcid or famotidine 20 Mg once daily before dinner.  Take OTC Mylanta, Tums, or Maalox as needed.  Recommend Lifestyle Modifications to prevent Acid Reflux.  Rec. Avoid coffee, sodas, peppermint, citrus fruits, and spicey foods.  Avoid eating 2-3 hours before bedtime.     2.  Dysphagia  Barium swallow with tablet  EGD with Possible Dilation  3.  Colon cancer screening  Scheduling Colonoscopy I discussed risks of colonoscopy with patient to include risk of bleeding, colon perforation, and risk of sedation.  Patient expressed understanding and agrees to proceed with colonoscopy.   Follow up 4 weeks after Procedures with TG.  Gary Nash

## 2023-03-07 NOTE — Telephone Encounter (Signed)
Patient return the call back. I schedule an office visit with Mrs. Gary Nash.

## 2023-03-08 ENCOUNTER — Ambulatory Visit: Payer: BC Managed Care – PPO | Admitting: Physician Assistant

## 2023-03-08 ENCOUNTER — Encounter: Payer: Self-pay | Admitting: Physician Assistant

## 2023-03-08 VITALS — BP 121/79 | HR 57 | Temp 98.6°F | Ht 70.0 in | Wt 174.0 lb

## 2023-03-08 DIAGNOSIS — R1310 Dysphagia, unspecified: Secondary | ICD-10-CM | POA: Diagnosis not present

## 2023-03-08 DIAGNOSIS — K219 Gastro-esophageal reflux disease without esophagitis: Secondary | ICD-10-CM | POA: Diagnosis not present

## 2023-03-08 DIAGNOSIS — Z1211 Encounter for screening for malignant neoplasm of colon: Secondary | ICD-10-CM

## 2023-03-08 MED ORDER — PEG 3350-KCL-NABCB-NACL-NASULF 236 G PO SOLR: 4000.0000 mL | Freq: Once | ORAL | 0 refills | Status: AC

## 2023-03-14 ENCOUNTER — Encounter: Payer: Self-pay | Admitting: Gastroenterology

## 2023-03-15 ENCOUNTER — Encounter: Payer: Self-pay | Admitting: Anesthesiology

## 2023-03-20 ENCOUNTER — Ambulatory Visit
Admission: RE | Admit: 2023-03-20 | Payer: BC Managed Care – PPO | Source: Home / Self Care | Admitting: Gastroenterology

## 2023-03-20 HISTORY — DX: Other complications of anesthesia, initial encounter: T88.59XA

## 2023-03-20 HISTORY — DX: Personal history of tuberculosis: Z86.11

## 2023-03-20 HISTORY — DX: Sciatica, right side: M54.31

## 2023-03-20 SURGERY — COLONOSCOPY WITH PROPOFOL
Anesthesia: Choice

## 2023-03-31 ENCOUNTER — Other Ambulatory Visit: Payer: Self-pay | Admitting: Nurse Practitioner

## 2023-03-31 DIAGNOSIS — K219 Gastro-esophageal reflux disease without esophagitis: Secondary | ICD-10-CM

## 2023-04-03 NOTE — Telephone Encounter (Signed)
Requested Prescriptions  Pending Prescriptions Disp Refills   famotidine (PEPCID) 20 MG tablet [Pharmacy Med Name: FAMOTIDINE 20 MG TABLET] 180 tablet 0    Sig: TAKE 1 TABLET BY MOUTH TWICE A DAY     Gastroenterology:  [facility]2 Antagonists Passed - 03/31/2023  1:37 AM      Passed - Valid encounter within last 12 months    Recent Outpatient Visits           1 month ago Leukopenia, unspecified type   [facility] Berniece Salines, FNP       Future Appointments             In 4 months Zane Herald, Rudolpho Sevin, FNP [facility]

## 2023-04-04 ENCOUNTER — Telehealth: Payer: Self-pay

## 2023-04-04 NOTE — Telephone Encounter (Signed)
This pt just left message on my voice mail. Said he was scheduled for a procedure with Dr Servando Snare, but it was cancelled because he had a sinus infection. York Spaniel he was calling to reschedule. Can you give him a call? Thanks!

## 2023-04-04 NOTE — Telephone Encounter (Signed)
Called pt, no answer and VM not set up

## 2023-04-13 ENCOUNTER — Telehealth: Payer: Self-pay

## 2023-04-13 NOTE — Telephone Encounter (Signed)
Pt scheduled with Dr. Servando Snare on 03/20/23 for his colonoscopy. Pt was unable to make that appt due to an illness. Pt would like to reschedule. Please call pt between 8:00am - 2:00pm. He works 2nd shift. I will cancel his follow up with Inetta Fermo that was scheduled for Oct 8th as this was to go over his results.

## 2023-04-14 ENCOUNTER — Telehealth: Payer: Self-pay

## 2023-04-14 ENCOUNTER — Other Ambulatory Visit: Payer: Self-pay

## 2023-04-14 DIAGNOSIS — K219 Gastro-esophageal reflux disease without esophagitis: Secondary | ICD-10-CM

## 2023-04-14 DIAGNOSIS — Z1211 Encounter for screening for malignant neoplasm of colon: Secondary | ICD-10-CM

## 2023-04-14 NOTE — Telephone Encounter (Signed)
Spoke with patient EGD/Colonoscopy scheduled 05/23/23 with Dr.Wohl @ [facility]. Patient will come to office to pick up new paperwork for bowel prep instruction.

## 2023-04-14 NOTE — Telephone Encounter (Signed)
Pt returned call to reschedule colonoscopy

## 2023-04-14 NOTE — Telephone Encounter (Signed)
  Left message for patient to return call to office so we can schedule Colonoscopy.

## 2023-04-18 ENCOUNTER — Ambulatory Visit: Payer: BC Managed Care – PPO | Admitting: Physician Assistant

## 2023-05-04 ENCOUNTER — Other Ambulatory Visit: Payer: Self-pay

## 2023-05-04 ENCOUNTER — Emergency Department
Admission: EM | Admit: 2023-05-04 | Discharge: 2023-05-04 | Disposition: A | Discharge status: Home / Self Care | Payer: BC Managed Care – PPO | Attending: Emergency Medicine | Admitting: Emergency Medicine

## 2023-05-04 DIAGNOSIS — R490 Dysphonia: Secondary | ICD-10-CM

## 2023-05-04 DIAGNOSIS — Z20822 Contact with and (suspected) exposure to covid-19: Secondary | ICD-10-CM | POA: Diagnosis not present

## 2023-05-04 DIAGNOSIS — J029 Acute pharyngitis, unspecified: Secondary | ICD-10-CM | POA: Diagnosis present

## 2023-05-04 LAB — RESP PANEL BY RT-PCR (RSV, FLU A&B, COVID)  RVPGX2
Influenza A by PCR: NEGATIVE
Influenza B by PCR: NEGATIVE
Resp Syncytial Virus by PCR: NEGATIVE
SARS Coronavirus 2 by RT PCR: NEGATIVE

## 2023-05-04 MED ORDER — DEXAMETHASONE SODIUM PHOSPHATE 10 MG/ML IJ SOLN
10.0000 mg | Freq: Once | INTRAMUSCULAR | Status: AC
  Administered 2023-05-04: 10 mg via INTRAMUSCULAR
  Filled 2023-05-04: qty 1

## 2023-05-04 NOTE — Discharge Instructions (Signed)
Take acetaminophen 650 mg and ibuprofen 400 mg every 6 hours for pain.  Take with food.   Keep your GI appointment as acid reflux and esophagitis may be the cause of your symptoms.  Continue taking omeprazole as prescribed.  Also call Dr. Willeen Cass of otolaryngology and schedule an appointment to check for your hoarseness and sore throat for the possibility of things like cancers.  Thank you for choosing [place] for your health care today!  Please see your primary doctor this week for a follow up appointment.   If you have any new, worsening, or unexpected symptoms call your doctor right away or come back to the emergency department for reevaluation.  It was my pleasure to care for you today.   Daneil Dan Modesto Charon, MD

## 2023-05-04 NOTE — ED Provider Triage Note (Signed)
Emergency Medicine Provider Triage Evaluation Note  ABHINAV SLONE , a 57 y.o. male  was evaluated in triage.  Pt complains of throat pain that has been ongoing for a while. Has an appointment scheduled with GI for an endoscopy in November, his PCP gave him an inhaler to use.   Review of Systems  Positive: Throat pain, globus sensation, cough Negative: fever  Physical Exam  There were no vitals taken for this visit. Gen:   Awake, no distress   Resp:  Normal effort  MSK:   Moves extremities without difficulty  Other:    Medical Decision Making  Medically screening exam initiated at 5:27 PM.  Appropriate orders placed.  EYOSIAS ANSTETT was informed that the remainder of the evaluation will be completed by another provider, this initial triage assessment does not replace that evaluation, and the importance of remaining in the ED until their evaluation is complete.     Cameron Ali, PA-C 05/04/23 1736

## 2023-05-04 NOTE — ED Triage Notes (Signed)
Pt has had ongoing throat pain with swelling intermittently on the right side.  Has GI appt in November.  States he was put on steroids by his PCP for his breathing.  Last night when he took his omeprazole he started having swelling in his throat about 15 min later.

## 2023-05-04 NOTE — ED Provider Notes (Signed)
[facility] Provider Note    Event Date/Time   First MD Initiated Contact with Patient 05/04/23 2051     (approximate)   History   Throat pain   HPI  Gary Nash is a 57 y.o. male   Past medical history of acid reflux who presents to the emergency department with 1 year progressively worsening hoarseness in his voice and throat pain.  He recently establish care with a primary doctor who started him on omeprazole.  He has a appointment with a GI specialist next month.  He does not smoke.  He used to drink alcohol but no longer does.   He denies abdominal pain.  He has no difficulty with breathing.  Yesterday when he took his omeprazole he felt to the pill caused irritation to his throat with a globus sensation that is now resolved.   External Medical Documents Reviewed: GI note from August 2024 documented history of dysphagia and GERD      Physical Exam   Triage Vital Signs: ED Triage Vitals  Encounter Vitals Group     BP 05/04/23 1730 (!) 153/95     Systolic BP Percentile --      Diastolic BP Percentile --      Pulse Rate 05/04/23 1730 60     Resp 05/04/23 1730 16     Temp 05/04/23 1730 98.2 F (36.8 C)     Temp Source 05/04/23 1730 Oral     SpO2 05/04/23 1730 100 %     Weight --      Height --      Head Circumference --      Peak Flow --      Pain Score 05/04/23 1729 0     Pain Loc --      Pain Education --      Exclude from Growth Chart --     Most recent vital signs: Vitals:   05/04/23 1730  BP: (!) 153/95  Pulse: 60  Resp: 16  Temp: 98.2 F (36.8 C)  SpO2: 100%    General: Awake, no distress.  CV:  Good peripheral perfusion.  Resp:  Normal effort.  Abd:  No distention.  Other:  Awake alert comfortable appearing no acute distress with normal vital signs slightly hypertensive.  Neck supple full range of motion no masses palpated externally.  Posterior oropharynx appears normal without erythema masses or exudates.   His voice is hoarse.   ED Results / Procedures / Treatments   Labs (all labs ordered are listed, but only abnormal results are displayed) Labs Reviewed  RESP PANEL BY RT-PCR (RSV, FLU A&B, COVID)  [id number]     I ordered and reviewed the above labs they are notable for negative respiratory viral panel.    PROCEDURES:  Critical Care performed: No  Procedures   MEDICATIONS ORDERED IN ED: Medications  dexamethasone (DECADRON) injection 10 mg (10 mg Intramuscular Given 05/04/23 2253)    IMPRESSION / MDM / ASSESSMENT AND PLAN / ED COURSE  I reviewed the triage vital signs and the nursing notes.                                Patient's presentation is most consistent with acute presentation with potential threat to life or bodily function.  Differential diagnosis includes, but is not limited to, viral pharyngitis, deep space neck infection, abscess, malignancy, GERD, esophagitis   The patient is  on the cardiac monitor to evaluate for evidence of arrhythmia and/or significant heart rate changes.  MDM:    No signs of airway obstruction in this patient with longstanding hoarseness of voice and sore throat.  Neck supple full range of motion, nontoxic, normal posterior oropharynx exam doubt deep space neck infection abscess or other emergent bacterial infectious pathologies.  Consider esophagitis with longstanding history of GERD.  Will continue with omeprazole and follow-up with GI as scheduled.  I also asked him to call ENT for an evaluation for potential malignancy.  Discharge.       FINAL CLINICAL IMPRESSION(S) / ED DIAGNOSES   Final diagnoses:  Pharyngitis, unspecified etiology  Hoarse voice quality     Rx / DC Orders   ED Discharge Orders     None        Note:  This document was prepared using Dragon voice recognition software and may include unintentional dictation errors.    Pilar Jarvis, MD 05/04/23 [phone]

## 2023-05-05 ENCOUNTER — Ambulatory Visit: Payer: Self-pay

## 2023-05-05 NOTE — Telephone Encounter (Signed)
Patient called, left VM to return the call to the office to speak to the NT.    Summary: antibiotic for sinus   Pt called saying he went to the ER last night for a sore throat and swelling where he could not breath.  They didn't give him an antibiotic but he thinks he needs one.  He is suppose to get a colonoscopy on the 12th nov.  He would like to talk to a nurse about an antibiotic.  CB@  [phone]

## 2023-05-05 NOTE — Telephone Encounter (Signed)
Chief Complaint: Sinus pressure and congestion  Symptoms: sinus pressure nasal drainage, and nasal congestion, fatigue   Frequency: constant  Pertinent Negatives: Patient denies chest pain, nasuea, vomiting Disposition: [] ED /[] Urgent Care (no appt availability in office) / [x] Appointment(In office/virtual)/ []  [facility]/ [] Home Care/ [] Refused Recommended Disposition /[] [facility]/ []  Follow-up with PCP Additional Notes: Patient stated he has had sinus pressure or the right side of his face and nasal congestion for over a week now. Patient states he was seen in the ED last night for neck swelling but they did not address the sinus issue and told him to follow up with PCP about those symptoms. Patient states he has a colonoscopy scheduled soon and want to make sure he is not sick before his appointment because they will make him reschedule if he has a sinus infection or some try of respiratory virus. Care advice was given and patient has been scheduled to see PCP on 05/08/23.  Reason for Disposition  [1] Sinus congestion (pressure, fullness) AND [2] present > 10 days  Answer Assessment - Initial Assessment Questions 1. LOCATION: "Where does it hurt?"      Around the nose and eyes on the right side of the face  2. ONSET: "When did the sinus pain start?"  (e.g., hours, days)      1 week ago  3. SEVERITY: "How bad is the pain?"   (Scale 1-10; mild, moderate or severe)   - MILD (1-3): doesn't interfere with normal activities    - MODERATE (4-7): interferes with normal activities (e.g., work or school) or awakens from sleep   - SEVERE (8-10): excruciating pain and patient unable to do any normal activities        Moderate  4. RECURRENT SYMPTOM: "Have you ever had sinus problems before?" If Yes, ask: "When was the last time?" and "What happened that time?"      Yes, I get sinus infection frequently  5. NASAL CONGESTION: "Is the nose blocked?" If Yes, ask: "Can you open it or  must you breathe through your mouth?"     Yes, right side of the nose  6. NASAL DISCHARGE: "Do you have discharge from your nose?" If so ask, "What color?"     Yes, clear right now  7. FEVER: "Do you have a fever?" If Yes, ask: "What is it, how was it measured, and when did it start?"      No  8. OTHER SYMPTOMS: "Do you have any other symptoms?" (e.g., sore throat, cough, earache, difficulty breathing)     Pressure on the right side of face, weak, congestion mild cough  Protocols used: Sinus Pain or Congestion-A-AH

## 2023-05-08 ENCOUNTER — Ambulatory Visit: Payer: BC Managed Care – PPO

## 2023-05-08 ENCOUNTER — Other Ambulatory Visit: Payer: Self-pay

## 2023-05-08 ENCOUNTER — Ambulatory Visit: Payer: BC Managed Care – PPO | Admitting: Nurse Practitioner

## 2023-05-08 ENCOUNTER — Encounter: Payer: Self-pay | Admitting: Nurse Practitioner

## 2023-05-08 VITALS — BP 122/84 | HR 72 | Temp 98.3°F | Resp 16 | Ht 70.0 in | Wt 181.2 lb

## 2023-05-08 DIAGNOSIS — R221 Localized swelling, mass and lump, neck: Secondary | ICD-10-CM | POA: Diagnosis not present

## 2023-05-08 DIAGNOSIS — R490 Dysphonia: Secondary | ICD-10-CM

## 2023-05-08 DIAGNOSIS — R1319 Other dysphagia: Secondary | ICD-10-CM | POA: Diagnosis not present

## 2023-05-08 NOTE — Progress Notes (Signed)
BP 122/84   Pulse 72   Temp 98.3 F (36.8 C) (Oral)   Resp 16   Ht 5\' 10"  (1.778 m)   Wt 181 lb 3.2 oz (82.2 kg)   SpO2 97%   BMI 26.00 kg/m    Subjective:    Patient ID: Gary Nash, male    DOB: 1965-12-16, 57 y.o.   MRN: [id number]  HPI: Gary Nash is a 57 y.o. male  Chief Complaint  Patient presents with   Sinusitis    Seen in ER   Facial Swelling    Swelling on left side that was restricting his breathing   Patient was seen in er on 05/04/2023 for same symptoms. He was given decadron injection.   Er note: No signs of airway obstruction in this patient with longstanding hoarseness of voice and sore throat. Neck supple full range of motion, nontoxic, normal posterior oropharynx exam doubt deep space neck infection abscess or other emergent bacterial infectious pathologies. Consider esophagitis with longstanding history of GERD. Will continue with omeprazole and follow-up with GI as scheduled. I also asked him to call ENT for an evaluation for potential malignancy. Discharge.   Dysphagia/neck swelling/horseness: patient reports he continues to have trouble with swallowing. He was seen at gi on 03/08/2023.  GI note: He is here to evaluate GERD and dysphagia.  Patient states he has history of GERD for over 10 years.  He remembers having an EGD in [place] 10 to 12 years ago, results unavailable.  Since then he has taken OTC meds sporadically as needed for acid reflux.  Has taken Tums, Pepcid, and Pepto-Bismol.  He reports having increased acid reflux with burning in his chest for 1 month.  Symptoms have been severe for the past week.  He has odynophagia and difficulty swallowing solid foods.  He is avoiding meat which feels like it is getting stuck in his chest.  Feels like his throat and esophagus are constricted.  Has sore throat with burning in his throat.  He took doxycycline antibiotic 2 to 3 weeks ago after a tick bite.  He has had 10 pound weight loss in the past month  due to upper GI symptoms.  He denies alcohol or tobacco use.  Denies family history of esophageal or stomach cancer.  He was just started on omeprazole 20 Mg once daily with little benefit.  Has also started OTC Pepcid. 1.  GERD             Scheduling EGD I discussed risks of EGD with patient to include risk of bleeding, perforation, and risk of sedation.  Patient expressed understanding and agrees to proceed with EGD.              Take omeprazole 20 Mg once daily before breakfast.  Increase to 2 tablets daily if needed.  Take Pepcid or famotidine 20 Mg once daily before dinner.  Take OTC Mylanta, Tums, or Maalox as needed.             Recommend Lifestyle Modifications to prevent Acid Reflux.  Rec. Avoid coffee, sodas, peppermint, citrus fruits, and spicey foods.  Avoid eating 2-3 hours before bedtime.               2.  Dysphagia             Barium swallow with tablet             EGD with Possible Dilation   3.  Colon cancer screening  Scheduling Colonoscopy I discussed risks of colonoscopy with patient to include risk of bleeding, colon perforation, and risk of sedation.  Patient expressed understanding and agrees to proceed with colonoscopy.    Follow up 4 weeks after Procedures with TG.  Patient reports that his right side of his neck has been swelling off and on since Wednesday night.  He says it will swell up and make it difficult for him swelling.  He says that it never goes away completely.  He reports that he has also had hoarseness of his voice.  Will get soft tissue ct scan and place referral to ent.    Relevant past medical, surgical, family and social history reviewed and updated as indicated. Interim medical history since our last visit reviewed. Allergies and medications reviewed and updated.  Review of Systems  Ten systems reviewed and is negative except as mentioned in HPI       Objective:    BP 122/84   Pulse 72   Temp 98.3 F (36.8 C) (Oral)   Resp 16    Ht 5\' 10"  (1.778 m)   Wt 181 lb 3.2 oz (82.2 kg)   SpO2 97%   BMI 26.00 kg/m   Wt Readings from Last 3 Encounters:  05/08/23 181 lb 3.2 oz (82.2 kg)  03/08/23 174 lb (78.9 kg)  02/21/23 180 lb 4.8 oz (81.8 kg)    Physical Exam  Constitutional: Patient appears well-developed and well-nourished.  No distress.  HEENT: head atraumatic, normocephalic, pupils equal and reactive to light, ears TMs clear, neck supple, swelling noted to right side of neck, no thyroid nodules noted on exam,  throat within normal limits Cardiovascular: Normal rate, regular rhythm and normal heart sounds.  No murmur heard. No BLE edema. Pulmonary/Chest: Effort normal and breath sounds normal. No respiratory distress. Abdominal: Soft.  There is no tenderness. Psychiatric: Patient has a normal mood and affect. behavior is normal. Judgment and thought content normal.   Results for orders placed or performed during the hospital encounter of 05/04/23  Resp panel by RT-PCR (RSV, Flu A&B, Covid) Anterior Nasal Swab   Specimen: Anterior Nasal Swab  Result Value Ref Range   SARS Coronavirus 2 by RT PCR NEGATIVE NEGATIVE   Influenza A by PCR NEGATIVE NEGATIVE   Influenza B by PCR NEGATIVE NEGATIVE   Resp Syncytial Virus by PCR NEGATIVE NEGATIVE      Assessment & Plan:   Problem List Items Addressed This Visit   None Visit Diagnoses     Esophageal dysphagia    -  Primary   follow up with GI, getting stat soft tissue ct scan and referral placed to ENT   Relevant Orders   CT Soft Tissue Neck W Contrast   Ambulatory referral to ENT   Hoarseness of voice       getting stat soft tissue ct scan and referral placed to ENT   Relevant Orders   CT Soft Tissue Neck W Contrast   Ambulatory referral to ENT   Neck swelling       getting stat soft tissue ct scan and referral placed to ENT   Relevant Orders   CT Soft Tissue Neck W Contrast   Ambulatory referral to ENT        Follow up plan: Return if symptoms worsen  or fail to improve.

## 2023-05-09 ENCOUNTER — Other Ambulatory Visit: Payer: Self-pay | Admitting: Nurse Practitioner

## 2023-05-09 DIAGNOSIS — R490 Dysphonia: Secondary | ICD-10-CM

## 2023-05-09 DIAGNOSIS — R221 Localized swelling, mass and lump, neck: Secondary | ICD-10-CM

## 2023-05-10 ENCOUNTER — Inpatient Hospital Stay: Admission: RE | Admit: 2023-05-10 | Payer: BC Managed Care – PPO | Source: Ambulatory Visit

## 2023-05-22 ENCOUNTER — Encounter: Payer: Self-pay | Admitting: Gastroenterology

## 2023-05-23 ENCOUNTER — Ambulatory Visit
Admission: RE | Admit: 2023-05-23 | Payer: BC Managed Care – PPO | Source: Home / Self Care | Admitting: Gastroenterology

## 2023-05-23 SURGERY — COLONOSCOPY WITH PROPOFOL
Anesthesia: General

## 2023-05-25 ENCOUNTER — Telehealth: Payer: Self-pay

## 2023-05-25 ENCOUNTER — Inpatient Hospital Stay: Admission: RE | Admit: 2023-05-25 | Payer: BC Managed Care – PPO | Source: Ambulatory Visit

## 2023-05-25 ENCOUNTER — Other Ambulatory Visit: Payer: Self-pay

## 2023-05-25 DIAGNOSIS — Z1211 Encounter for screening for malignant neoplasm of colon: Secondary | ICD-10-CM

## 2023-05-25 DIAGNOSIS — R1310 Dysphagia, unspecified: Secondary | ICD-10-CM

## 2023-05-25 DIAGNOSIS — K219 Gastro-esophageal reflux disease without esophagitis: Secondary | ICD-10-CM

## 2023-05-25 MED ORDER — NA SULFATE-K SULFATE-MG SULF 17.5-3.13-1.6 GM/177ML PO SOLN
354.0000 mL | Freq: Once | ORAL | 0 refills | Status: AC
Start: 2023-05-25 — End: 2023-05-25

## 2023-05-25 NOTE — Telephone Encounter (Signed)
Patient states on 05/23/2023 he ate when he was supposed to be on clear liquids for his colonoscopy and had to cancel his procedure. He states someone was supposed to call him to reschedule his procedure but no on never did. Reschedule his procedure with Dr. Servando Snare on 06/12/2023 in [place]. Went over instructions, mailed them and sent to [employer]. Sent prep to pharmacy which was [facility]. Patient states he is unable to get in to [facility] because he forgot the password. Reset the password for patient and so he can get in.

## 2023-06-06 ENCOUNTER — Encounter: Payer: Self-pay | Admitting: Gastroenterology

## 2023-06-16 ENCOUNTER — Other Ambulatory Visit: Payer: Self-pay

## 2023-06-16 ENCOUNTER — Ambulatory Visit
Admission: RE | Admit: 2023-06-16 | Discharge: 2023-06-16 | Disposition: A | Discharge status: Home / Self Care | Payer: BC Managed Care – PPO | Attending: Gastroenterology | Admitting: Gastroenterology

## 2023-06-16 ENCOUNTER — Ambulatory Visit: Payer: BC Managed Care – PPO | Admitting: Anesthesiology

## 2023-06-16 ENCOUNTER — Encounter: Payer: Self-pay | Admitting: Gastroenterology

## 2023-06-16 ENCOUNTER — Encounter
Admission: RE | Disposition: A | Discharge status: Home / Self Care | Payer: Self-pay | Source: Home / Self Care | Attending: Gastroenterology

## 2023-06-16 DIAGNOSIS — R1310 Dysphagia, unspecified: Secondary | ICD-10-CM | POA: Insufficient documentation

## 2023-06-16 DIAGNOSIS — K640 First degree hemorrhoids: Secondary | ICD-10-CM | POA: Insufficient documentation

## 2023-06-16 DIAGNOSIS — K2289 Other specified disease of esophagus: Secondary | ICD-10-CM | POA: Diagnosis not present

## 2023-06-16 DIAGNOSIS — Z1211 Encounter for screening for malignant neoplasm of colon: Secondary | ICD-10-CM | POA: Diagnosis present

## 2023-06-16 DIAGNOSIS — R1319 Other dysphagia: Secondary | ICD-10-CM

## 2023-06-16 DIAGNOSIS — J45909 Unspecified asthma, uncomplicated: Secondary | ICD-10-CM | POA: Diagnosis not present

## 2023-06-16 DIAGNOSIS — K219 Gastro-esophageal reflux disease without esophagitis: Secondary | ICD-10-CM | POA: Insufficient documentation

## 2023-06-16 HISTORY — PX: ESOPHAGOGASTRODUODENOSCOPY (EGD) WITH PROPOFOL: SHX5813

## 2023-06-16 HISTORY — DX: Family history of other specified conditions: Z84.89

## 2023-06-16 HISTORY — PX: COLONOSCOPY WITH PROPOFOL: SHX5780

## 2023-06-16 SURGERY — COLONOSCOPY WITH PROPOFOL
Anesthesia: General | Site: Rectum

## 2023-06-16 MED ORDER — LIDOCAINE HCL (CARDIAC) PF 100 MG/5ML IV SOSY
PREFILLED_SYRINGE | INTRAVENOUS | Status: DC | PRN
  Administered 2023-06-16: 100 mg via INTRAVENOUS

## 2023-06-16 MED ORDER — STERILE WATER FOR IRRIGATION IR SOLN
Status: DC | PRN
  Administered 2023-06-16: 120 mL

## 2023-06-16 MED ORDER — PROPOFOL 10 MG/ML IV BOLUS
INTRAVENOUS | Status: DC | PRN
  Administered 2023-06-16: 50 mg via INTRAVENOUS
  Administered 2023-06-16: 25 mg via INTRAVENOUS
  Administered 2023-06-16: 100 mg via INTRAVENOUS
  Administered 2023-06-16: 25 mg via INTRAVENOUS
  Administered 2023-06-16: 50 mg via INTRAVENOUS
  Administered 2023-06-16: 25 mg via INTRAVENOUS

## 2023-06-16 MED ORDER — LACTATED RINGERS IV SOLN: INTRAVENOUS | Status: DC

## 2023-06-16 MED ORDER — STERILE WATER FOR IRRIGATION IR SOLN
Status: DC | PRN
  Administered 2023-06-16: 1

## 2023-06-16 SURGICAL SUPPLY — 32 items
BALLN DILATOR 12-15 8 (BALLOONS)
BALLN DILATOR 15-18 8 (BALLOONS) ×2
BALLN DILATOR CRE 0-12 8 (BALLOONS)
BALLN DILATOR ESOPH 8 10 CRE (MISCELLANEOUS) IMPLANT
BALLOON DILATOR 12-15 8 (BALLOONS) IMPLANT
BALLOON DILATOR 15-18 8 (BALLOONS) IMPLANT
BALLOON DILATOR CRE 0-12 8 (BALLOONS) IMPLANT
BLOCK BITE 60FR ADLT L/F GRN (MISCELLANEOUS) ×2 IMPLANT
CLIP HMST 235XBRD CATH ROT (MISCELLANEOUS) IMPLANT
ELECT REM PT RETURN 9FT ADLT (ELECTROSURGICAL)
ELECTRODE REM PT RTRN 9FT ADLT (ELECTROSURGICAL) IMPLANT
FCP ESCP3.2XJMB 240X2.8X (MISCELLANEOUS)
FORCEPS BIOP RAD 4 LRG CAP 4 (CUTTING FORCEPS) IMPLANT
FORCEPS ESCP3.2XJMB 240X2.8X (MISCELLANEOUS) IMPLANT
GOWN CVR UNV OPN BCK APRN NK (MISCELLANEOUS) ×4 IMPLANT
INJECTOR VARIJECT VIN23 (MISCELLANEOUS) IMPLANT
KIT DEFENDO VALVE AND CONN (KITS) IMPLANT
KIT PRC NS LF DISP ENDO (KITS) ×2 IMPLANT
MANIFOLD NEPTUNE II (INSTRUMENTS) ×2 IMPLANT
MARKER SPOT ENDO TATTOO 5ML (MISCELLANEOUS) IMPLANT
PROBE APC STR FIRE (PROBE) IMPLANT
RETRIEVER NET PLAT FOOD (MISCELLANEOUS) IMPLANT
RETRIEVER NET ROTH 2.5X230 LF (MISCELLANEOUS) IMPLANT
SNARE COLD EXACTO (MISCELLANEOUS) IMPLANT
SNARE SHORT THROW 13M SML OVAL (MISCELLANEOUS) IMPLANT
SNARE SHORT THROW 30M LRG OVAL (MISCELLANEOUS) IMPLANT
SNARE SNG USE RND 15MM (INSTRUMENTS) IMPLANT
SYR INFLATION 60ML (SYRINGE) IMPLANT
TRAP ETRAP POLY (MISCELLANEOUS) IMPLANT
VARIJECT INJECTOR VIN23 (MISCELLANEOUS)
WATER STERILE IRR 250ML POUR (IV SOLUTION) ×2 IMPLANT
WIRE CRE 18-20MM 8CM F G (MISCELLANEOUS) IMPLANT

## 2023-06-16 NOTE — Op Note (Addendum)
[facility] Patient Name: Gary Nash Procedure Date: 06/16/2023 9:51 AM MRN: [id number] Account #: [id number] Date of Birth: [birth year] Admit Type: Outpatient Age: 57 Room: [facility] OR ROOM 01 Gender: Male Note Status: Finalized Instrument Name: [id number] Procedure:             Upper GI endoscopy Indications:           Dysphagia Providers:             Midge Minium MD, MD Referring MD:          Midge Minium MD, MD (Referring MD), Rudolpho Sevin. Pender                         (Referring MD) Medicines:             Propofol per Anesthesia Complications:         No immediate complications. Procedure:             Pre-Anesthesia Assessment:                        - Prior to the procedure, a History and Physical was                         performed, and patient medications and allergies were                         reviewed. The patient's tolerance of previous                         anesthesia was also reviewed. The risks and benefits                         of the procedure and the sedation options and risks                         were discussed with the patient. All questions were                         answered, and informed consent was obtained. Prior                         Anticoagulants: The patient has taken no anticoagulant                         or antiplatelet agents. ASA Grade Assessment: II - A                         patient with mild systemic disease. After reviewing                         the risks and benefits, the patient was deemed in                         satisfactory condition to undergo the procedure.                        After obtaining informed consent, the endoscope was  passed under direct vision. Throughout the procedure,                         the patient's blood pressure, pulse, and oxygen                         saturations were monitored continuously. The Endoscope                         was  introduced through the mouth, and advanced to the                         second part of duodenum. The upper GI endoscopy was                         accomplished without difficulty. The patient tolerated                         the procedure well. Findings:      The examined esophagus was normal. One biopsy was obtained with cold       forceps for histology in the middle third of the esophagus. A TTS       dilator was passed through the scope. Dilation with a 15-16.5-18 mm       balloon dilator was performed to 18 mm. The dilation site was examined       and showed complete resolution of luminal narrowing.      The stomach was normal.      The examined duodenum was normal. Impression:            - Normal esophagus. Dilated.                        - Normal stomach.                        - Normal examined duodenum.                        - Biopsies performed in the middle third of the                         esophagus. Recommendation:        - Discharge patient to home.                        - Resume previous diet.                        - Continue present medications.                        - Await pathology results. Procedure Code(s):     --- Professional ---                        [phone], Esophagogastroduodenoscopy, flexible,                         transoral; with transendoscopic balloon dilation of  esophagus (less than 30 mm diameter)                        43239, 59, Esophagogastroduodenoscopy, flexible,                         transoral; with biopsy, single or multiple Diagnosis Code(s):     --- Professional ---                        R13.10, Dysphagia, unspecified CPT copyright 2022 American Medical Association. All rights reserved. The codes documented in this report are preliminary and upon coder review may  be revised to meet current compliance requirements. Midge Minium MD, MD 06/16/2023 10:11:32 AM This report has been signed  electronically. Number of Addenda: 0 Note Initiated On: 06/16/2023 9:51 AM Total Procedure Duration: 0 hours 0 minutes 44 seconds  Estimated Blood Loss:  Estimated blood loss: none.      [facility]

## 2023-06-16 NOTE — Op Note (Addendum)
[facility] Patient Name: Gary Nash Procedure Date: 06/16/2023 9:48 AM MRN: [id number] Account #: [id number] Date of Birth: 08-25-1965 Admit Type: Outpatient Age: 57 Room: [facility] OR ROOM 01 Gender: Male Note Status: Finalized Instrument Name: [id number] Procedure:             Colonoscopy Indications:           Screening for colorectal malignant neoplasm Providers:             Midge Minium MD, MD Referring MD:          Midge Minium MD, MD (Referring MD), Rudolpho Sevin. Pender                         (Referring MD) Medicines:             Propofol per Anesthesia Complications:         No immediate complications. Procedure:             Pre-Anesthesia Assessment:                        - Prior to the procedure, a History and Physical was                         performed, and patient medications and allergies were                         reviewed. The patient's tolerance of previous                         anesthesia was also reviewed. The risks and benefits                         of the procedure and the sedation options and risks                         were discussed with the patient. All questions were                         answered, and informed consent was obtained. Prior                         Anticoagulants: The patient has taken no anticoagulant                         or antiplatelet agents. ASA Grade Assessment: II - A                         patient with mild systemic disease. After reviewing                         the risks and benefits, the patient was deemed in                         satisfactory condition to undergo the procedure.                        After obtaining informed consent, the colonoscope was  passed under direct vision. Throughout the procedure,                         the patient's blood pressure, pulse, and oxygen                         saturations were monitored continuously. The                          Colonoscope was introduced through the anus and                         advanced to the the cecum, identified by appendiceal                         orifice and ileocecal valve. The colonoscopy was                         performed without difficulty. The patient tolerated                         the procedure well. The quality of the bowel                         preparation was excellent. Findings:      The perianal and digital rectal examinations were normal.      Non-bleeding internal hemorrhoids were found during retroflexion. The       hemorrhoids were Grade I (internal hemorrhoids that do not prolapse). Impression:            - Non-bleeding internal hemorrhoids.                        - No specimens collected. Recommendation:        - Discharge patient to home.                        - Resume previous diet.                        - Continue present medications.                        - Repeat colonoscopy in 10 years for screening                         purposes. Procedure Code(s):     --- Professional ---                        [phone], Colonoscopy, flexible; diagnostic, including                         collection of specimen(s) by brushing or washing, when                         performed (separate procedure) Diagnosis Code(s):     --- Professional ---                        Z12.11, Encounter for screening for malignant neoplasm  of colon CPT copyright 2022 American Medical Association. All rights reserved. The codes documented in this report are preliminary and upon coder review may  be revised to meet current compliance requirements. Midge Minium MD, MD 06/16/2023 10:23:27 AM This report has been signed electronically. Number of Addenda: 0 Note Initiated On: 06/16/2023 9:48 AM Scope Withdrawal Time: 0 hours 7 minutes 4 seconds  Total Procedure Duration: 0 hours 9 minutes 21 seconds  Estimated Blood Loss:  Estimated blood loss: none.      [facility]

## 2023-06-16 NOTE — Anesthesia Postprocedure Evaluation (Signed)
Anesthesia Post Note  Patient: Gary Nash  Procedure(s) Performed: COLONOSCOPY WITH PROPOFOL (Rectum) ESOPHAGOGASTRODUODENOSCOPY (EGD) WITH PROPOFOL (Mouth)  Patient location during evaluation: PACU Anesthesia Type: General Level of consciousness: awake and alert Pain management: pain level controlled Vital Signs Assessment: post-procedure vital signs reviewed and stable Respiratory status: spontaneous breathing, nonlabored ventilation, respiratory function stable and patient connected to nasal cannula oxygen Cardiovascular status: blood pressure returned to baseline and stable Postop Assessment: no apparent nausea or vomiting Anesthetic complications: no   No notable events documented.   Last Vitals:  Vitals:   06/16/23 1030 06/16/23 1036  BP: 113/86 (!) 119/90  Pulse: 74 67  Resp: (!) 21 14  Temp:  36.7 C  SpO2: 98% 100%    Last Pain:  Vitals:   06/16/23 1036  TempSrc:   PainSc: 0-No pain                 Cleda Mccreedy Suzane Vanderweide

## 2023-06-16 NOTE — H&P (Signed)
Midge Minium, MD [facility] [address] [place], [place] [address] Phone:[phone] Fax : [phone]  Primary Care Physician:  Berniece Salines, FNP Primary Gastroenterologist:  Dr. Servando Snare  Pre-Procedure History & Physical: HPI:  Gary Nash is a 57 y.o. male is here for an endoscopy and colonoscopy.   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  Took medicines"for a couple of years".   Sciatica of right side     Past Surgical History:  Procedure Laterality Date   WISDOM TOOTH EXTRACTION  2022    Prior to Admission medications   Medication Sig Start Date End Date Taking? Authorizing Provider  famotidine (PEPCID) 20 MG tablet TAKE 1 TABLET BY MOUTH TWICE A DAY 04/03/23  Yes Berniece Salines, FNP  Misc Natural Products (BEET ROOT PO) Take by mouth.   Yes [provider]  Multiple Vitamins-Minerals (ZINC PO) Take by mouth daily.   Yes [provider]  omeprazole (PRILOSEC) 20 MG capsule Take 1 capsule (20 mg total) by mouth daily. 02/21/23  Yes Berniece Salines, FNP  Albuterol-Budesonide (AIRSUPRA) 90-80 MCG/ACT AERO Inhale 1-2 puffs into the lungs as needed. Patient not taking: Reported on 03/14/2023 02/21/23   Berniece Salines, FNP  methocarbamol (ROBAXIN) 500 MG tablet Take 1 tablet (500 mg total) by mouth 2 (two) times daily as needed for muscle spasms. Patient not taking: Reported on 03/14/2023 02/21/23   Berniece Salines, FNP    Allergies as of 05/25/2023 - Review Complete 05/22/2023  Allergen Reaction Noted   Azithromycin Nausea Only 09/04/2018   Crab (diagnostic) Itching and Swelling 03/14/2023    Family History  Problem Relation Age of Onset   Healthy Mother     Hypertension Father    Erectile dysfunction Father    Diabetes Father    Hypertension Brother     Social History   Socioeconomic History   Marital status: Single    Spouse name: Not on file   Number of children: 3   Years of education: Not on file   Highest education level: Not on file  Occupational History   Not on file  Tobacco Use   Smoking status: Never   Smokeless tobacco: Never  Vaping Use   Vaping status: Never Used  Substance and Sexual Activity   Alcohol use: Not Currently    Comment: Occasional   Drug use: No   Sexual activity: Yes  Other Topics Concern   Not on file  Social History Narrative   Not on file   Social Determinants of Health   Financial Resource Strain: Not on file  Food Insecurity: Not on file  Transportation Needs: Not on file  Physical Activity: Not on file  Stress: Not on file  Social Connections: Not on file  Intimate Partner Violence: Not on file    Review of Systems: See HPI, otherwise negative ROS  Physical Exam: BP (!) 143/79   Temp 98 F (36.7 C) (Temporal)   Resp 14   Ht 5\' 10"  (1.778 m)   Wt 78 kg   SpO2 99%   BMI 24.68 kg/m  General:   Alert,  pleasant and cooperative in NAD Head:  Normocephalic and atraumatic. Neck:  Supple; no masses or thyromegaly. Lungs:  Clear throughout to auscultation.    Heart:  Regular rate and rhythm. Abdomen:  Soft, nontender and nondistended. Normal bowel sounds, without guarding, and without rebound.   Neurologic:  Alert and  oriented x4;  grossly normal neurologically.  Impression/Plan: Gary Nash is here for an endoscopy and colonoscopy to be performed for dysphagia and screening  Risks, benefits, limitations, and alternatives regarding  endoscopy and colonoscopy have been reviewed with the patient.  Questions have been answered.  All parties agreeable.   Midge Minium, MD  06/16/2023, 9:15 AM

## 2023-06-16 NOTE — Anesthesia Preprocedure Evaluation (Signed)
Anesthesia Evaluation  Patient identified by MRN, date of birth, ID band Patient awake    Reviewed: Allergy & Precautions, NPO status , Patient's Chart, lab work & pertinent test results  History of Anesthesia Complications Negative for: history of anesthetic complications  Airway Mallampati: III  TM Distance: >3 FB Neck ROM: full    Dental  (+) Chipped   Pulmonary neg shortness of breath, asthma    Pulmonary exam normal        Cardiovascular Exercise Tolerance: Good (-) angina (-) Past MI negative cardio ROS Normal cardiovascular exam     Neuro/Psych  Neuromuscular disease  negative psych ROS   GI/Hepatic Neg liver ROS,GERD  Controlled,,  Endo/Other  negative endocrine ROS    Renal/GU negative Renal ROS  negative genitourinary   Musculoskeletal   Abdominal   Peds  Hematology negative hematology ROS (+)   Anesthesia Other Findings Patient reports that they do not think that any food or pills are stuck in their throat at this time.  Past Medical History: No date: Asthma No date: Benign prostate hyperplasia No date: Complication of anesthesia     Comment:  Dizziness (when playing video games after tooth               extraction) No date: Elevated PSA No date: Family history of adverse reaction to anesthesia     Comment:  Father - PONV No date: GERD (gastroesophageal reflux disease) No date: Hx of tuberculosis     Comment:  as child.  Took medicines"for a couple of years". No date: Sciatica of right side  Past Surgical History: 2022: WISDOM TOOTH EXTRACTION  BMI    Body Mass Index: 24.68 kg/m      Reproductive/Obstetrics negative OB ROS                             Anesthesia Physical Anesthesia Plan  ASA: 2  Anesthesia Plan: General   Post-op Pain Management:    Induction: Intravenous  PONV Risk Score and Plan: Propofol infusion and TIVA  Airway Management Planned:  Natural Airway and Nasal Cannula  Additional Equipment:   Intra-op Plan:   Post-operative Plan:   Informed Consent: I have reviewed the patients History and Physical, chart, labs and discussed the procedure including the risks, benefits and alternatives for the proposed anesthesia with the patient or authorized representative who has indicated his/her understanding and acceptance.     Dental Advisory Given  Plan Discussed with: Anesthesiologist, CRNA and Surgeon  Anesthesia Plan Comments: (Patient consented for risks of anesthesia including but not limited to:  - adverse reactions to medications - risk of airway placement if required - damage to eyes, teeth, lips or other oral mucosa - nerve damage due to positioning  - sore throat or hoarseness - Damage to heart, brain, nerves, lungs, other parts of body or loss of life  Patient voiced understanding and assent.)       Anesthesia Quick Evaluation

## 2023-06-16 NOTE — Transfer of Care (Signed)
Immediate Anesthesia Transfer of Care Note  Patient: Gary Nash  Procedure(s) Performed: COLONOSCOPY WITH PROPOFOL (Rectum) ESOPHAGOGASTRODUODENOSCOPY (EGD) WITH PROPOFOL (Mouth)  Patient Location: PACU  Anesthesia Type: General  Level of Consciousness: awake, alert  and patient cooperative  Airway and Oxygen Therapy: Patient Spontanous Breathing and Patient connected to supplemental oxygen  Post-op Assessment: Post-op Vital signs reviewed, Patient's Cardiovascular Status Stable, Respiratory Function Stable, Patent Airway and No signs of Nausea or vomiting  Post-op Vital Signs: Reviewed and stable  Complications: No notable events documented.

## 2023-06-17 ENCOUNTER — Encounter: Payer: Self-pay | Admitting: Gastroenterology

## 2023-06-20 LAB — SURGICAL PATHOLOGY

## 2023-06-22 ENCOUNTER — Encounter: Payer: Self-pay | Admitting: Gastroenterology

## 2023-07-06 ENCOUNTER — Ambulatory Visit: Payer: Self-pay | Admitting: *Deleted

## 2023-07-06 NOTE — Telephone Encounter (Signed)
  Chief Complaint: Difficulty swallowiing, "Breaths are more shallow." Symptoms: States had Endoscopy 06/16/23  Dr Servando Snare. "Stretched my throat I guess." States felt better until Tuesday night, then right back how it was." No choking, states can feel solids and liquids "Go down."  Swelling right side neck as previously. States breathing is normal, denies SOB, "Just seen more shallow."  Frequency: Tuesday Pertinent Negatives: Patient denies SOB Disposition: [] ED /[] Urgent Care (no appt availability in office / [] Appointment(In office/virtual)/ []  [facility]/ [] Home Care/ [] Refused Recommended Disposition /[] [facility]/ [x]  Follow-up with PCP Additional Notes:   Advised pt to alert Dr. Servando Snare of symptoms as he did procedure. Advised to call back if needed. Pt verbalizes understanding. Reason for Disposition  Swallowing difficulty is a chronic symptom (recurrent or ongoing AND present > 4 weeks)  Answer Assessment - Initial Assessment Questions 1. DESCRIPTION: "Tell me more about this problem." "Are you  having trouble swallowing liquids, solids, or both?" "Any trouble with swallowing saliva (spit)?"     Normal, swelling side on right side of neck 2. SEVERITY: "How bad is the swallowing difficulty?"  (e.g., Scale 1-10; or mild, moderate, severe)   - MILD (0-3): Occasional swallowing difficulty; has trouble swallowing certain types of foods or liquids.   - MODERATE (4-7): Frequent swallowing difficulty; only able to swallow small amounts of foods and fluids.   - SEVERE (8-10): Unable to swallow any foods, fluids, or saliva; sensation of "lump in throat" or "something stuck in throat", and frequent drooling or spitting may be present.     Feel it go down 3. ONSET: "When did the swallowing problems begin?"      Surgery Friday, sun and Mon Tues "Good" Tuesday night back to like it was. 4. CAUSE: "What do you think is causing the problem?"  (e.g., dry mouth, food or pill stuck in  throat, mouth pain, sore throat, progression of disease process such as dementia or Parkinson's disease).      Surgery 5. CHRONIC or RECURRENT: "Is this a new problem for you?"  If No, ask: "How long have you had this problem?" (e.g., days, weeks, months)      Chronic, had procedure  6. OTHER SYMPTOMS: "Do you have any other symptoms?" (e.g., chest pain, difficulty breathing, mouth sores, sore throat, swollen tongue, chest pain)    "When breathing, more shallow."  Protocols used: Swallowing Difficulty-A-AH

## 2023-08-24 ENCOUNTER — Ambulatory Visit: Payer: BC Managed Care – PPO | Admitting: Nurse Practitioner

## 2023-08-24 NOTE — Progress Notes (Deleted)
   There were no vitals taken for this visit.   Subjective:    Patient ID: Gary Nash, male    DOB: 1966/02/24, 58 y.o.   MRN: [id number]  HPI: Gary Nash is a 58 y.o. male  No chief complaint on file.   Discussed the use of AI scribe software for clinical note transcription with the patient, who gave verbal consent to proceed.  History of Present Illness           05/08/2023    9:02 AM 02/21/2023    9:43 AM  Depression screen PHQ 2/9  Decreased Interest 0 0  Down, Depressed, Hopeless 0 0  PHQ - 2 Score 0 0  Altered sleeping  0  Tired, decreased energy  1  Change in appetite  1  Feeling bad or failure about yourself   0  Trouble concentrating  0  Moving slowly or fidgety/restless  0  Suicidal thoughts  0  PHQ-9 Score  2  Difficult doing work/chores  Not difficult at all    Relevant past medical, surgical, family and social history reviewed and updated as indicated. Interim medical history since our last visit reviewed. Allergies and medications reviewed and updated.  Review of Systems  Per HPI unless specifically indicated above     Objective:    There were no vitals taken for this visit.  {Vitals History (Optional):23777} Wt Readings from Last 3 Encounters:  06/16/23 172 lb (78 kg)  05/08/23 181 lb 3.2 oz (82.2 kg)  03/08/23 174 lb (78.9 kg)    Physical Exam  Results for orders placed or performed during the hospital encounter of 06/16/23  Surgical pathology   Collection Time: 06/16/23 12:00 AM  Result Value Ref Range   SURGICAL PATHOLOGY      SURGICAL PATHOLOGY [facility] [address], [address] [place], [place] [address] Telephone [phone] or [phone] Fax [phone]  REPORT OF SURGICAL PATHOLOGY   Accession #: [id number] Patient Name: Gary Nash Visit # : [id number]  MRN: [id number] Physician: Midge Minium DOB/Age [birth year] (Age: 33) Gender: M Collected Date: 06/16/2023 Received Date:  06/16/2023  FINAL DIAGNOSIS       1. Esophagus, biopsy, Mid :       - UNREMARKABLE SQUAMOUS MUCOSA.      - NEGATIVE FOR INTRAEPITHELIAL EOSINOPHILS, DYSPLASIA, AND MALIGNANCY.       DATE SIGNED OUT: 06/20/2023 ELECTRONIC SIGNATURE : Gary Nash, Delice Bison , Pathologist, Electronic Signature  MICROSCOPIC DESCRIPTION  CASE COMMENTS STAINS USED IN DIAGNOSIS: H&E    CLINICAL HISTORY  SPECIMEN(S) OBTAINED 1. Esophagus, biopsy, Mid  SPECIMEN COMMENTS: SPECIMEN CLINICAL INFORMATION: 1. EGD GERD dysphagia,  colonoscopy,    Gross Description 1. Received in formalin are  tan, soft tissue fragments that are submitted in toto.  Number:  one,   Size:  0.2 x 0.2 x 0.1 cm submitted entirely in one block. (KW:gt, 06/19/23)        Report signed out from the following location(s) [place] [address], [place] [address] CLIA #: [id number]  [facility] [address] [place], [place] [address] CLIA #: [id number]    {Labs (Optional):23779}    Assessment & Plan:   Problem List Items Addressed This Visit   None    Assessment and Plan             Follow up plan: No follow-ups on file.

## 2023-08-29 ENCOUNTER — Other Ambulatory Visit: Payer: Self-pay | Admitting: Nurse Practitioner

## 2023-08-29 DIAGNOSIS — K219 Gastro-esophageal reflux disease without esophagitis: Secondary | ICD-10-CM

## 2023-08-29 NOTE — Telephone Encounter (Signed)
 Requested Prescriptions  Pending Prescriptions Disp Refills   omeprazole (PRILOSEC) 20 MG capsule [Pharmacy Med Name: OMEPRAZOLE DR 20 MG CAPSULE] 90 capsule 0    Sig: TAKE 1 CAPSULE BY MOUTH EVERY DAY     Gastroenterology: Proton Pump Inhibitors Passed - 08/29/2023  3:44 PM      Passed - Valid encounter within last 12 months    Recent Outpatient Visits           3 months ago Esophageal dysphagia   [facility] [place], FNP   6 months ago Leukopenia, unspecified type   [facility] [place], [place]

## 2023-09-11 NOTE — Progress Notes (Unsigned)
   There were no vitals taken for this visit.   Subjective:    Patient ID: HELIO LACK, male    DOB: 1966-02-06, 58 y.o.   MRN: [id number]  HPI: MOURAD CWIKLA is a 58 y.o. male  No chief complaint on file.   Discussed the use of AI scribe software for clinical note transcription with the patient, who gave verbal consent to proceed.  History of Present Illness           05/08/2023    9:02 AM 02/21/2023    9:43 AM  Depression screen PHQ 2/9  Decreased Interest 0 0  Down, Depressed, Hopeless 0 0  PHQ - 2 Score 0 0  Altered sleeping  0  Tired, decreased energy  1  Change in appetite  1  Feeling bad or failure about yourself   0  Trouble concentrating  0  Moving slowly or fidgety/restless  0  Suicidal thoughts  0  PHQ-9 Score  2  Difficult doing work/chores  Not difficult at all    Relevant past medical, surgical, family and social history reviewed and updated as indicated. Interim medical history since our last visit reviewed. Allergies and medications reviewed and updated.  Review of Systems  Per HPI unless specifically indicated above     Objective:    There were no vitals taken for this visit.  {Vitals History (Optional):23777} Wt Readings from Last 3 Encounters:  06/16/23 172 lb (78 kg)  05/08/23 181 lb 3.2 oz (82.2 kg)  03/08/23 174 lb (78.9 kg)    Physical Exam  Results for orders placed or performed during the hospital encounter of 06/16/23  Surgical pathology   Collection Time: 06/16/23 12:00 AM  Result Value Ref Range   SURGICAL PATHOLOGY      SURGICAL PATHOLOGY [facility] [address], [address] [place], [place] [address] Telephone [phone] or [phone] Fax [phone]  REPORT OF SURGICAL PATHOLOGY   Accession #: [id number] Patient Name: NICODEMUS, DENK Visit # : [id number]  MRN: [id number] Physician: Midge Minium DOB/Age 09/02/1965 (Age: 34) Gender: M Collected Date: 06/16/2023 Received Date:  06/16/2023  FINAL DIAGNOSIS       1. Esophagus, biopsy, Mid :       - UNREMARKABLE SQUAMOUS MUCOSA.      - NEGATIVE FOR INTRAEPITHELIAL EOSINOPHILS, DYSPLASIA, AND MALIGNANCY.       DATE SIGNED OUT: 06/20/2023 ELECTRONIC SIGNATURE : Oneita Kras Md, Delice Bison , Pathologist, Electronic Signature  MICROSCOPIC DESCRIPTION  CASE COMMENTS STAINS USED IN DIAGNOSIS: H&E    CLINICAL HISTORY  SPECIMEN(S) OBTAINED 1. Esophagus, biopsy, Mid  SPECIMEN COMMENTS: SPECIMEN CLINICAL INFORMATION: 1. EGD GERD dysphagia,  colonoscopy,    Gross Description 1. Received in formalin are  tan, soft tissue fragments that are submitted in toto.  Number:  one,   Size:  0.2 x 0.2 x 0.1 cm submitted entirely in one block. (KW:gt, 06/19/23)        Report signed out from the following location(s) [place] [address], [place] [address] CLIA #: [id number]  [facility] [address] [place], [place] [address] CLIA #: [id number]    {Labs (Optional):23779}    Assessment & Plan:   Problem List Items Addressed This Visit   None    Assessment and Plan             Follow up plan: No follow-ups on file.

## 2023-09-12 ENCOUNTER — Ambulatory Visit: Admitting: Nurse Practitioner

## 2023-09-12 ENCOUNTER — Encounter: Payer: Self-pay | Admitting: Nurse Practitioner

## 2023-09-12 VITALS — BP 124/78 | HR 74 | Temp 97.7°F | Resp 16 | Ht 70.0 in | Wt 180.2 lb

## 2023-09-12 DIAGNOSIS — R221 Localized swelling, mass and lump, neck: Secondary | ICD-10-CM | POA: Diagnosis not present

## 2023-09-13 ENCOUNTER — Encounter: Payer: Self-pay | Admitting: Nurse Practitioner

## 2023-09-13 ENCOUNTER — Other Ambulatory Visit: Payer: Self-pay | Admitting: Nurse Practitioner

## 2023-09-13 DIAGNOSIS — D72819 Decreased white blood cell count, unspecified: Secondary | ICD-10-CM

## 2023-09-13 DIAGNOSIS — R221 Localized swelling, mass and lump, neck: Secondary | ICD-10-CM

## 2023-09-13 LAB — CBC WITH DIFFERENTIAL/PLATELET
Absolute Lymphocytes: 1792 {cells}/uL (ref 850–3900)
Absolute Monocytes: 472 {cells}/uL (ref 200–950)
Basophils Absolute: 10 {cells}/uL (ref 0–200)
Basophils Relative: 0.3 %
Eosinophils Absolute: 40 {cells}/uL (ref 15–500)
Eosinophils Relative: 1.2 %
HCT: 40.6 % (ref 38.5–50.0)
Hemoglobin: 14 g/dL (ref 13.2–17.1)
MCH: 31.1 pg (ref 27.0–33.0)
MCHC: 34.5 g/dL (ref 32.0–36.0)
MCV: 90.2 fL (ref 80.0–100.0)
MPV: 9.4 fL (ref 7.5–12.5)
Monocytes Relative: 14.3 %
Neutro Abs: 987 {cells}/uL — ABNORMAL LOW (ref 1500–7800)
Neutrophils Relative %: 29.9 %
Platelets: 212 10*3/uL (ref 140–400)
RBC: 4.5 10*6/uL (ref 4.20–5.80)
RDW: 12.6 % (ref 11.0–15.0)
Total Lymphocyte: 54.3 %
WBC: 3.3 10*3/uL — ABNORMAL LOW (ref 3.8–10.8)

## 2023-09-13 LAB — COMPLETE METABOLIC PANEL WITH GFR
AG Ratio: 1.6 (calc) (ref 1.0–2.5)
ALT: 12 U/L (ref 9–46)
AST: 14 U/L (ref 10–35)
Albumin: 4.6 g/dL (ref 3.6–5.1)
Alkaline phosphatase (APISO): 46 U/L (ref 35–144)
BUN: 19 mg/dL (ref 7–25)
CO2: 26 mmol/L (ref 20–32)
Calcium: 9.6 mg/dL (ref 8.6–10.3)
Chloride: 104 mmol/L (ref 98–110)
Creat: 1.11 mg/dL (ref 0.70–1.30)
Globulin: 2.9 g/dL (ref 1.9–3.7)
Glucose, Bld: 93 mg/dL (ref 65–99)
Potassium: 3.8 mmol/L (ref 3.5–5.3)
Sodium: 138 mmol/L (ref 135–146)
Total Bilirubin: 0.4 mg/dL (ref 0.2–1.2)
Total Protein: 7.5 g/dL (ref 6.1–8.1)
eGFR: 78 mL/min/{1.73_m2} (ref >=60)

## 2023-09-13 LAB — TSH: TSH: 2.73 m[IU]/L (ref 0.40–4.50)

## 2023-09-18 ENCOUNTER — Encounter: Payer: Self-pay | Admitting: Nurse Practitioner

## 2023-09-20 ENCOUNTER — Inpatient Hospital Stay: Admission: RE | Admit: 2023-09-20 | Source: Ambulatory Visit

## 2023-09-20 ENCOUNTER — Ambulatory Visit
Admission: RE | Admit: 2023-09-20 | Discharge: 2023-09-20 | Disposition: A | Discharge status: Home / Self Care | Source: Ambulatory Visit | Attending: Nurse Practitioner | Admitting: Nurse Practitioner

## 2023-09-20 DIAGNOSIS — R221 Localized swelling, mass and lump, neck: Secondary | ICD-10-CM

## 2023-09-20 MED ORDER — IOPAMIDOL (ISOVUE-370) INJECTION 76%
75.0000 mL | Freq: Once | INTRAVENOUS | Status: AC | PRN
  Administered 2023-09-20: 75 mL via INTRAVENOUS

## 2023-09-21 ENCOUNTER — Encounter: Payer: Self-pay | Admitting: Nurse Practitioner

## 2023-11-02 ENCOUNTER — Telehealth: Payer: Self-pay

## 2023-11-02 DIAGNOSIS — K219 Gastro-esophageal reflux disease without esophagitis: Secondary | ICD-10-CM

## 2023-11-02 MED ORDER — OMEPRAZOLE 20 MG PO CPDR: 20.0000 mg | DELAYED_RELEASE_CAPSULE | Freq: Every day | ORAL | 0 refills | Status: DC

## 2023-11-02 NOTE — Telephone Encounter (Signed)
 Pt needs refill on Omeprazole  to be sent to [facility] in [place]. Pt states he is completely out.

## 2023-11-09 ENCOUNTER — Emergency Department
Admission: EM | Admit: 2023-11-09 | Discharge: 2023-11-09 | Disposition: A | Discharge status: Home / Self Care | Attending: Emergency Medicine | Admitting: Emergency Medicine

## 2023-11-09 ENCOUNTER — Other Ambulatory Visit: Payer: Self-pay

## 2023-11-09 ENCOUNTER — Emergency Department

## 2023-11-09 DIAGNOSIS — R531 Weakness: Secondary | ICD-10-CM | POA: Diagnosis not present

## 2023-11-09 DIAGNOSIS — R079 Chest pain, unspecified: Secondary | ICD-10-CM | POA: Diagnosis present

## 2023-11-09 DIAGNOSIS — J029 Acute pharyngitis, unspecified: Secondary | ICD-10-CM | POA: Diagnosis not present

## 2023-11-09 DIAGNOSIS — R221 Localized swelling, mass and lump, neck: Secondary | ICD-10-CM | POA: Insufficient documentation

## 2023-11-09 DIAGNOSIS — J45909 Unspecified asthma, uncomplicated: Secondary | ICD-10-CM | POA: Insufficient documentation

## 2023-11-09 DIAGNOSIS — R0602 Shortness of breath: Secondary | ICD-10-CM | POA: Insufficient documentation

## 2023-11-09 LAB — TROPONIN I (HIGH SENSITIVITY)
Troponin I (High Sensitivity): 2 ng/L (ref <18)
Troponin I (High Sensitivity): 2 ng/L (ref <18)

## 2023-11-09 LAB — CBC WITH DIFFERENTIAL/PLATELET
Abs Immature Granulocytes: 0.01 10*3/uL (ref 0.00–0.07)
Basophils Absolute: 0 10*3/uL (ref 0.0–0.1)
Basophils Relative: 0 %
Eosinophils Absolute: 0 10*3/uL (ref 0.0–0.5)
Eosinophils Relative: 1 %
HCT: 42.1 % (ref 39.0–52.0)
Hemoglobin: 14.5 g/dL (ref 13.0–17.0)
Immature Granulocytes: 0 %
Lymphocytes Relative: 44 %
Lymphs Abs: 1.5 10*3/uL (ref 0.7–4.0)
MCH: 31 pg (ref 26.0–34.0)
MCHC: 34.4 g/dL (ref 30.0–36.0)
MCV: 90.1 fL (ref 80.0–100.0)
Monocytes Absolute: 0.4 10*3/uL (ref 0.1–1.0)
Monocytes Relative: 11 %
Neutro Abs: 1.4 10*3/uL — ABNORMAL LOW (ref 1.7–7.7)
Neutrophils Relative %: 44 %
Platelets: 208 10*3/uL (ref 150–400)
RBC: 4.67 MIL/uL (ref 4.22–5.81)
RDW: 12.7 % (ref 11.5–15.5)
WBC: 3.3 10*3/uL — ABNORMAL LOW (ref 4.0–10.5)
nRBC: 0 % (ref 0.0–0.2)

## 2023-11-09 LAB — BASIC METABOLIC PANEL WITH GFR
Anion gap: 9 (ref 5–15)
BUN: 13 mg/dL (ref 6–20)
CO2: 22 mmol/L (ref 22–32)
Calcium: 9.4 mg/dL (ref 8.9–10.3)
Chloride: 103 mmol/L (ref 98–111)
Creatinine, Ser: 1.19 mg/dL (ref 0.61–1.24)
GFR, Estimated: >60 mL/min (ref >60)
Glucose, Bld: 95 mg/dL (ref 70–99)
Potassium: 3.9 mmol/L (ref 3.5–5.1)
Sodium: 134 mmol/L — ABNORMAL LOW (ref 135–145)

## 2023-11-09 NOTE — ED Notes (Signed)
 First Nurse Note: Pt to ED via [facility] c/o chest pain and shortness of breath. Sx's started last night and are intermittent. Pt did use his inhaler today.

## 2023-11-09 NOTE — ED Triage Notes (Signed)
 Recent sinus infection.  Presents today from [facility] for c/O right sided chest pain.  Was at [facility] for evaluation for leg weakness, recent URI, treated with Antibiotics.    C/O bilateral let weakness and fatigue upon waking this morning.  AAOx3.  Skin warm and dry. NAD

## 2023-11-09 NOTE — Discharge Instructions (Signed)
 Please schedule follow-up appointment with your primary care provider in regards to the swelling in your neck.  I believe this is from a lymph node.  This can be further evaluated with ultrasound or possible biopsy if your primary care provider deems necessary.  Return to the emergency department with any worsening symptoms.

## 2023-11-09 NOTE — ED Provider Notes (Signed)
 [facility] Provider Note    Event Date/Time   First MD Initiated Contact with Patient 11/09/23 1931     (approximate)   History   Chest Pain   HPI  Gary Nash is a 58 y.o. male with PMH of asthma, GERD and sciatica presents for evaluation of sore throat, leg weakness and chest pain.  Patient stated this morning when he got up he noticed that his legs felt weak.  He then noticed some swelling on the left side of his neck and felt like it was hard for him to swallow.  He was walking up to be seen at the [facility] he developed some chest pain and shortness of breath and was recommended to come to the emergency department for further evaluation.  Patient was recently treated for URI with cefdinir.      Physical Exam   Triage Vital Signs: ED Triage Vitals  Encounter Vitals Group     BP 11/09/23 1549 134/86     Systolic BP Percentile --      Diastolic BP Percentile --      Pulse Rate 11/09/23 1549 64     Resp 11/09/23 1549 17     Temp 11/09/23 1549 98.6 F (37 C)     Temp src --      SpO2 11/09/23 1549 99 %     Weight 11/09/23 1552 180 lb 1.9 oz (81.7 kg)     Height --      Head Circumference --      Peak Flow --      Pain Score 11/09/23 1551 0     Pain Loc --      Pain Education --      Exclude from Growth Chart --     Most recent vital signs: Vitals:   11/09/23 1549 11/09/23 1945  BP: 134/86 131/76  Pulse: 64 65  Resp: 17 16  Temp: 98.6 F (37 C)   SpO2: 99% 100%    General: Awake, no distress.  CV:  Good peripheral perfusion. RRR. Resp:  Normal effort. CTAB. Abd:  No distention.  Other:  Oral mucus membranes are moist, no pharyngeal erythema, no tonsillar enlargement or exudates, no palpable nodules or lymph nodes in the neck   ED Results / Procedures / Treatments   Labs (all labs ordered are listed, but only abnormal results are displayed) Labs Reviewed  CBC WITH DIFFERENTIAL/PLATELET - Abnormal; Notable for the  following components:      Result Value   WBC 3.3 (*)    Neutro Abs 1.4 (*)    All other components within normal limits  BASIC METABOLIC PANEL WITH GFR - Abnormal; Notable for the following components:   Sodium 134 (*)    All other components within normal limits  TROPONIN I (HIGH SENSITIVITY)  TROPONIN I (HIGH SENSITIVITY)     EKG  ED provider interpretation: Sinus bradycardia with sinus arrhythmia.  Vent. rate 59 BPM PR interval 156 ms QRS duration 78 ms QT/QTcB 378/374 ms P-R-T axes 64 23 -14   RADIOLOGY  Chest x-ray obtained, I interpreted the images as well as reviewed the radiologist report, which was negative for any acute cardiopulmonary abnormalities.  PROCEDURES:  Critical Care performed: No  Procedures   MEDICATIONS ORDERED IN ED: Medications - No data to display   IMPRESSION / MDM / ASSESSMENT AND PLAN / ED COURSE  I reviewed the triage vital signs and the nursing notes.  58 year old male presents for evaluation of chest pain, throat pain and leg weakness.  Vital signs are stable patient NAD on exam.  Differential diagnosis includes, but is not limited to, ACS, pleurisy, pericarditis, lymphedema, allergic reaction, tonsillitis.  Patient's presentation is most consistent with acute complicated illness / injury requiring diagnostic workup.  CBC shows mildly decreased WBCs otherwise unremarkable.  BMP unremarkable.  Troponin negative x 2.  Chest x-ray is normal.  EKG does not show arrhythmia or ST elevation.  At the time of my assessment patient reports that all of his symptoms have improved.  Patient is no longer having chest pain.  Patient states that his legs are not feeling as weak as they were before.  He reports that the swelling in his neck has gone down.  Workup from a cardiac standpoint is very reassuring.  Suspect that patient's chest pain was related to pleurisy or pericarditis given his recent URI.  I believe the  nodule he felt in his neck was a swollen lymph node.  I think that patient's weakness in his legs is related to the URI.  It sounds like patient is still not back to his baseline from the URI.  Patient was really more concerned about the bump on his neck than his chest pain.  I encouraged him to schedule a follow-up appointment with his primary care provider as this could be ultrasound and possibly biopsied in the future.  I do not see any indication for further emergent workup at this time.  Given that patient's symptoms have improved I feel he is stable for outpatient management.  Patient voiced understanding, all questions were answered and he was stable at discharge.      FINAL CLINICAL IMPRESSION(S) / ED DIAGNOSES   Final diagnoses:  Chest pain, unspecified type     Rx / DC Orders   ED Discharge Orders     None        Note:  This document was prepared using Dragon voice recognition software and may include unintentional dictation errors.   Phyliss Breen, PA-C 11/10/23 0008    Collis Deaner, MD 11/10/23 1352

## 2023-11-09 NOTE — ED Provider Triage Note (Signed)
 Emergency Medicine Provider Triage Evaluation Note  Gary Nash , a 58 y.o. male  was evaluated in triage.  Pt complains of CP. Patient was going to the [facility] for evaluation of leg weakness that began this morning when he developed CP and was directed to come the ED. Patient was recently treated for a sinus infection and things the CP is coming from his lungs.  Review of Systems  Positive: CP, leg weakness, SOB Negative:   Physical Exam  There were no vitals taken for this visit. Gen:   Awake, no distress   Resp:  Normal effort  MSK:   Moves extremities without difficulty  Other:    Medical Decision Making  Medically screening exam initiated at 3:47 PM.  Appropriate orders placed.  MITHCELL OZBIRN was informed that the remainder of the evaluation will be completed by another provider, this initial triage assessment does not replace that evaluation, and the importance of remaining in the ED until their evaluation is complete.     Phyliss Breen, PA-C 11/09/23 1552

## 2023-11-15 ENCOUNTER — Ambulatory Visit: Admitting: Nurse Practitioner

## 2023-11-15 ENCOUNTER — Encounter: Payer: Self-pay | Admitting: Nurse Practitioner

## 2023-11-15 VITALS — BP 134/72 | HR 74 | Temp 98.0°F | Resp 18 | Ht 70.0 in | Wt 175.1 lb

## 2023-11-15 DIAGNOSIS — F330 Major depressive disorder, recurrent, mild: Secondary | ICD-10-CM | POA: Insufficient documentation

## 2023-11-15 DIAGNOSIS — F419 Anxiety disorder, unspecified: Secondary | ICD-10-CM

## 2023-11-15 DIAGNOSIS — R591 Generalized enlarged lymph nodes: Secondary | ICD-10-CM | POA: Diagnosis not present

## 2023-11-15 DIAGNOSIS — D72819 Decreased white blood cell count, unspecified: Secondary | ICD-10-CM | POA: Diagnosis not present

## 2023-11-15 MED ORDER — ESCITALOPRAM OXALATE 10 MG PO TABS: 10.0000 mg | ORAL_TABLET | Freq: Every day | ORAL | 0 refills | Status: DC

## 2023-11-15 NOTE — Progress Notes (Signed)
 BP 134/72   Pulse 74   Temp 98 F (36.7 C)   Resp 18   Ht 5\' 10"  (1.778 m)   Wt 175 lb 1.6 oz (79.4 kg)   SpO2 98%   BMI 25.12 kg/m    Subjective:    Patient ID: Gary Nash, male    DOB: 1965/08/02, 58 y.o.   MRN: [id number]  HPI: Gary Nash is a 58 y.o. male  Chief Complaint  Patient presents with   ER follow-up    Swollen lymph node right side of neck    Discussed the use of AI scribe software for clinical note transcription with the patient, who gave verbal consent to proceed.  History of Present Illness Gary Nash is a 58 year old male who presents for er follow up.   He has been experiencing swelling on the right side of his neck, accompanied by difficulty breathing, severe throat pain, headaches, and weakness in his legs. These symptoms led to an emergency room visit last Thursday, blood work was performed. A biopsy was recommended. This has been an ongoing problem.  Previously got a ct neck which was negative. Labs were unremarkable except for low wbc.  Patient was referred to hematology but was never seen.   He has a history of low white blood cell count but has not followed up with hematology. He also had a high PSA level about ten years ago, but recent tests, including a prostate exam and cholesterol levels, were normal as of August 2024.  He experiences stress and depression, previously managed with Zoloft, which he discontinued due to stomach discomfort and headaches. His stress is attributed to family responsibilities, including caring for a handicapped brother and an elderly father. He has not tried other medications for these symptoms.  He reports sensitivity to salt, experiencing symptoms after consuming salty foods, and monitors his blood pressure, which is slightly elevated at 134/72. He enjoys sweets but has reduced intake due to blurry vision, which improved upon cutting back on sugar.  He tries to maintain hydration but admits his water  intake  is inconsistent. No current sore throat when swallowing.    EXAM: CT NECK WITH CONTRAST   TECHNIQUE: Multidetector CT imaging of the neck was performed using the standard protocol following the bolus administration of intravenous contrast.   RADIATION DOSE REDUCTION: This exam was performed according to the departmental dose-optimization program which includes automated exposure control, adjustment of the mA and/or kV according to patient size and/or use of iterative reconstruction technique.   CONTRAST:  75mL ISOVUE -370 IOPAMIDOL  (ISOVUE -370) INJECTION 76%   COMPARISON:  CTA neck 04/09/2018   FINDINGS: Pharynx and larynx: No evidence of a mass or swelling. Patent airway. No fluid collection or inflammatory changes in the parapharyngeal or retropharyngeal spaces.   Salivary glands: No inflammation, mass, or stone.   Thyroid: Unremarkable.   Lymph nodes: No enlarged or suspicious lymph nodes in the neck.   Vascular: Major vascular structures of the neck are patent.   Limited intracranial: Unremarkable.   Visualized orbits: Unremarkable.   Mastoids and visualized paranasal sinuses: Clear.   Skeleton: No acute osseous abnormality or destructive lesion. Mild cervical spondylosis.   Upper chest: Clear lung apices.   Other: No fluid collection or substantial soft tissue swelling/inflammation is identified with special attention to the right anterior neck/face.   IMPRESSION: No acute abnormality identified in the neck.     Electronically Signed   By: Constantine Delude.D.  On: 09/20/2023 16:38     11/15/2023   11:39 AM 11/15/2023   11:10 AM 05/08/2023    9:02 AM  Depression screen PHQ 2/9  Decreased Interest 0 0 0  Down, Depressed, Hopeless 0 0 0  PHQ - 2 Score 0 0 0  Altered sleeping 0 0   Tired, decreased energy 0 0   Change in appetite 0 0   Feeling bad or failure about yourself  0 0   Trouble concentrating 0 0   Moving slowly or fidgety/restless 0 0    Suicidal thoughts 0 0   PHQ-9 Score 0 0   Difficult doing work/chores Not difficult at all Not difficult at all        11/15/2023   11:40 AM 11/15/2023   11:10 AM 02/21/2023    9:43 AM  GAD 7 : Generalized Anxiety Score  Nervous, Anxious, on Edge 1 0 3  Control/stop worrying 1 0 3  Worry too much - different things 1 0 3  Trouble relaxing 1 0 3  Restless 1 0 3  Easily annoyed or irritable 1 0 1  Afraid - awful might happen 1 0 3  Total GAD 7 Score 7 0 19  Anxiety Difficulty Not difficult at all Not difficult at all Somewhat difficult     Relevant past medical, surgical, family and social history reviewed and updated as indicated. Interim medical history since our last visit reviewed. Allergies and medications reviewed and updated.  Review of Systems  Ten systems reviewed and is negative except as mentioned in HPI      Objective:    BP 134/72   Pulse 74   Temp 98 F (36.7 C)   Resp 18   Ht 5\' 10"  (1.778 m)   Wt 175 lb 1.6 oz (79.4 kg)   SpO2 98%   BMI 25.12 kg/m    Wt Readings from Last 3 Encounters:  11/15/23 175 lb 1.6 oz (79.4 kg)  11/09/23 180 lb 1.9 oz (81.7 kg)  09/12/23 180 lb 3.2 oz (81.7 kg)    Physical Exam Physical Exam VITALS: BP- 134/72 GENERAL: Alert, cooperative, well developed, no acute distress HEENT: Normocephalic, normal oropharynx, moist mucous membranes, throat normal without sore throat on swallowing CHEST: Clear to auscultation bilaterally, no wheezes, rhonchi, or crackles CARDIOVASCULAR: Normal heart rate and rhythm, S1 and S2 normal without murmurs ABDOMEN: Soft, non-tender, non-distended, without organomegaly, normal bowel sounds EXTREMITIES: No cyanosis or edema NEUROLOGICAL: Cranial nerves grossly intact, moves all extremities without gross motor or sensory deficit   Results for orders placed or performed during the hospital encounter of 11/09/23  CBC with Differential   Collection Time: 11/09/23  3:49 PM  Result Value Ref Range    WBC 3.3 (L) 4.0 - 10.5 K/uL   RBC 4.67 4.22 - 5.81 MIL/uL   Hemoglobin 14.5 13.0 - 17.0 g/dL   HCT 82.9 56.2 - 13.0 %   MCV 90.1 80.0 - 100.0 fL   MCH 31.0 26.0 - 34.0 pg   MCHC 34.4 30.0 - 36.0 g/dL   RDW 86.5 78.4 - 69.6 %   Platelets 208 150 - 400 K/uL   nRBC 0.0 0.0 - 0.2 %   Neutrophils Relative % 44 %   Neutro Abs 1.4 (L) 1.7 - 7.7 K/uL   Lymphocytes Relative 44 %   Lymphs Abs 1.5 0.7 - 4.0 K/uL   Monocytes Relative 11 %   Monocytes Absolute 0.4 0.1 - 1.0 K/uL   Eosinophils Relative 1 %  Eosinophils Absolute 0.0 0.0 - 0.5 K/uL   Basophils Relative 0 %   Basophils Absolute 0.0 0.0 - 0.1 K/uL   Immature Granulocytes 0 %   Abs Immature Granulocytes 0.01 0.00 - 0.07 K/uL  Basic metabolic panel   Collection Time: 11/09/23  3:49 PM  Result Value Ref Range   Sodium 134 (L) 135 - 145 mmol/L   Potassium 3.9 3.5 - 5.1 mmol/L   Chloride 103 98 - 111 mmol/L   CO2 22 22 - 32 mmol/L   Glucose, Bld 95 70 - 99 mg/dL   BUN 13 6 - 20 mg/dL   Creatinine, Ser 6.04 0.61 - 1.24 mg/dL   Calcium 9.4 8.9 - 54.0 mg/dL   GFR, Estimated >98 >11 mL/min   Anion gap 9 5 - 15  Troponin I (High Sensitivity)   Collection Time: 11/09/23  3:49 PM  Result Value Ref Range   Troponin I (High Sensitivity) 2 <18 ng/L  Troponin I (High Sensitivity)   Collection Time: 11/09/23  6:09 PM  Result Value Ref Range   Troponin I (High Sensitivity) 2 <18 ng/L       Assessment & Plan:   Problem List Items Addressed This Visit       Other   Leukocytopenia (Chronic)   Relevant Orders   Ambulatory referral to Hematology / Oncology   Anxiety   Relevant Medications   escitalopram (LEXAPRO) 10 MG tablet   Mild episode of recurrent major depressive disorder (HCC)   Relevant Medications   escitalopram (LEXAPRO) 10 MG tablet   Other Visit Diagnoses       Lymphadenopathy    -  Primary   Relevant Orders   US  SOFT TISSUE HEAD & NECK (NON-THYROID)   Ambulatory referral to ENT        Assessment and  Plan Assessment & Plan Borderline hypertension Blood pressure is 134/72 mmHg, indicating borderline hypertension. No pharmacological intervention is required at this time. Advised to maintain lifestyle modifications, including reducing salt intake due to sensitivity. - Monitor blood pressure regularly - Advise on lifestyle modifications, including reducing salt intake  Depression Previously on Zoloft, which caused stomach discomfort and headaches. Stressors include family responsibilities and recent bereavements. Initiating Lexapro at the lowest dose due to its favorable side effect profile and quicker onset of action. Lexapro is safe for a wide range of patients, including children and pregnant individuals, with a lower incidence of side effects compared to other medications in its class. - Prescribe Lexapro at the lowest dose - Schedule follow-up appointment in 4 weeks to assess response to Lexapro  Swollen lymph node in neck Swelling on the right side of the neck, likely a lymph node. Previous CT scan showed no abnormalities. Plan to investigate further with an ultrasound and refer to an ENT specialist for a biopsy to rule out serious conditions. - Order ultrasound of the neck - Refer to ENT specialist for biopsy  Low white blood cell count Previous lab results indicated a low white blood cell count. No follow-up with hematology has been done yet. - Provide contact information for hematology for further evaluation        Follow up plan: Return in about 4 weeks (around 12/13/2023) for follow up.

## 2023-11-15 NOTE — Patient Instructions (Addendum)
 Ultrasound scheduling [phone]  [facility] [phone]  [facility] [phone]

## 2023-11-23 ENCOUNTER — Ambulatory Visit
Admission: RE | Admit: 2023-11-23 | Discharge: 2023-11-23 | Disposition: A | Discharge status: Home / Self Care | Source: Ambulatory Visit | Attending: Nurse Practitioner | Admitting: Nurse Practitioner

## 2023-11-23 DIAGNOSIS — R591 Generalized enlarged lymph nodes: Secondary | ICD-10-CM | POA: Insufficient documentation

## 2023-11-24 ENCOUNTER — Ambulatory Visit: Payer: Self-pay | Admitting: Nurse Practitioner

## 2023-12-07 ENCOUNTER — Inpatient Hospital Stay: Attending: Internal Medicine | Admitting: Internal Medicine

## 2023-12-07 ENCOUNTER — Inpatient Hospital Stay

## 2023-12-12 ENCOUNTER — Other Ambulatory Visit: Payer: Self-pay | Admitting: Nurse Practitioner

## 2023-12-12 DIAGNOSIS — F330 Major depressive disorder, recurrent, mild: Secondary | ICD-10-CM

## 2023-12-12 DIAGNOSIS — F419 Anxiety disorder, unspecified: Secondary | ICD-10-CM

## 2023-12-12 NOTE — Telephone Encounter (Signed)
 Requested medications are due for refill today.  yes  Requested medications are on the active medications list.  yes  Last refill. 11/15/2023 #30 0 rf  Future visit scheduled.   yes  Notes to clinic.  New medication to this pt.    Requested Prescriptions  Pending Prescriptions Disp Refills   escitalopram  (LEXAPRO ) 10 MG tablet [Pharmacy Med Name: ESCITALOPRAM  10 MG TABLET] 30 tablet 0    Sig: TAKE 1 TABLET BY MOUTH EVERY DAY     Psychiatry:  Antidepressants - SSRI Failed - 12/12/2023  4:46 PM      Failed - Valid encounter within last 6 months    Recent Outpatient Visits           3 weeks ago Lymphadenopathy   [facility] Quinton Buckler, FNP   3 months ago Neck swelling   [facility] Quinton Buckler, [place]              Passed - Completed PHQ-2 or PHQ-9 in the last 360 days

## 2023-12-13 ENCOUNTER — Ambulatory Visit: Admitting: Nurse Practitioner

## 2024-01-17 ENCOUNTER — Other Ambulatory Visit: Payer: Self-pay

## 2024-01-17 ENCOUNTER — Emergency Department

## 2024-01-17 ENCOUNTER — Emergency Department
Admission: EM | Admit: 2024-01-17 | Discharge: 2024-01-17 | Disposition: A | Discharge status: Home / Self Care | Attending: Emergency Medicine | Admitting: Emergency Medicine

## 2024-01-17 DIAGNOSIS — D72829 Elevated white blood cell count, unspecified: Secondary | ICD-10-CM | POA: Insufficient documentation

## 2024-01-17 DIAGNOSIS — J45909 Unspecified asthma, uncomplicated: Secondary | ICD-10-CM | POA: Insufficient documentation

## 2024-01-17 DIAGNOSIS — R079 Chest pain, unspecified: Secondary | ICD-10-CM | POA: Insufficient documentation

## 2024-01-17 DIAGNOSIS — R5381 Other malaise: Secondary | ICD-10-CM | POA: Diagnosis not present

## 2024-01-17 DIAGNOSIS — R5383 Other fatigue: Secondary | ICD-10-CM | POA: Diagnosis not present

## 2024-01-17 DIAGNOSIS — R531 Weakness: Secondary | ICD-10-CM | POA: Diagnosis not present

## 2024-01-17 DIAGNOSIS — R519 Headache, unspecified: Secondary | ICD-10-CM | POA: Diagnosis not present

## 2024-01-17 LAB — BASIC METABOLIC PANEL WITH GFR
Anion gap: 9 (ref 5–15)
BUN: 12 mg/dL (ref 6–20)
CO2: 26 mmol/L (ref 22–32)
Calcium: 9.1 mg/dL (ref 8.9–10.3)
Chloride: 101 mmol/L (ref 98–111)
Creatinine, Ser: 1.09 mg/dL (ref 0.61–1.24)
GFR, Estimated: >60 mL/min (ref >60)
Glucose, Bld: 85 mg/dL (ref 70–99)
Potassium: 4.1 mmol/L (ref 3.5–5.1)
Sodium: 136 mmol/L (ref 135–145)

## 2024-01-17 LAB — CBC
HCT: 39.6 % (ref 39.0–52.0)
Hemoglobin: 13.6 g/dL (ref 13.0–17.0)
MCH: 31 pg (ref 26.0–34.0)
MCHC: 34.3 g/dL (ref 30.0–36.0)
MCV: 90.2 fL (ref 80.0–100.0)
Platelets: 198 K/uL (ref 150–400)
RBC: 4.39 MIL/uL (ref 4.22–5.81)
RDW: 12.6 % (ref 11.5–15.5)
WBC: 3.1 K/uL — ABNORMAL LOW (ref 4.0–10.5)
nRBC: 0 % (ref 0.0–0.2)

## 2024-01-17 LAB — TROPONIN I (HIGH SENSITIVITY): Troponin I (High Sensitivity): <2 ng/L (ref <18)
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
## 2024-01-17 NOTE — ED Triage Notes (Signed)
 Patient states chest pain and headache since working outside on Monday; was sent over from [facility].

## 2024-01-17 NOTE — Discharge Instructions (Signed)
 You can take 650 mg of Tylenol and 600 mg of ibuprofen every 6 hours as needed for pain. You can use ice, heat, muscle creams and other topical pain relievers as well.

## 2024-01-28 ENCOUNTER — Other Ambulatory Visit: Payer: Self-pay | Admitting: Nurse Practitioner

## 2024-01-28 DIAGNOSIS — K219 Gastro-esophageal reflux disease without esophagitis: Secondary | ICD-10-CM

## 2024-01-30 NOTE — Telephone Encounter (Signed)
 Requested Prescriptions  Pending Prescriptions Disp Refills   omeprazole  (PRILOSEC) 20 MG capsule [Pharmacy Med Name: OMEPRAZOLE  DR 20 MG CAPSULE] 90 capsule 0    Sig: TAKE 1 CAPSULE BY MOUTH EVERY DAY     Gastroenterology: Proton Pump Inhibitors Passed - 01/30/2024  1:38 PM      Passed - Valid encounter within last 12 months    Recent Outpatient Visits           2 months ago Lymphadenopathy   [facility] Gareth Mliss [place], FNP   4 months ago Neck swelling   [facility] Gareth Mliss FALCON, [place]

## 2024-03-26 ENCOUNTER — Ambulatory Visit: Admitting: Nurse Practitioner

## 2024-03-26 ENCOUNTER — Encounter: Payer: Self-pay | Admitting: Nurse Practitioner

## 2024-03-26 VITALS — BP 122/78 | HR 98 | Resp 18 | Ht 70.0 in | Wt 177.1 lb

## 2024-03-26 DIAGNOSIS — Z0289 Encounter for other administrative examinations: Secondary | ICD-10-CM | POA: Diagnosis not present

## 2024-03-26 DIAGNOSIS — J069 Acute upper respiratory infection, unspecified: Secondary | ICD-10-CM

## 2024-03-26 NOTE — Progress Notes (Signed)
 BP 122/78   Pulse 98   Resp 18   Ht 5' 10 (1.778 m)   Wt 177 lb 1.6 oz (80.3 kg)   SpO2 98%   BMI 25.41 kg/m    Subjective:    Patient ID: Gary Nash, male    DOB: 1966/04/11, 58 y.o.   MRN: [id number]  HPI: Gary Nash is a 58 y.o. male  Chief Complaint  Patient presents with   URI    Needs fmla for work out sick 9/9-9/16   Discussed the use of AI scribe software for clinical note transcription with the patient, who gave verbal consent to proceed.  History of Present Illness Gary Nash is a 58 year old male who presents for FMLA paperwork completion after an acute respiratory illness.  Acute respiratory illness - Onset of symptoms approximately March 12, 2024, with urgent care visit on March 19, 2024 - Symptoms included fever, chills, sore throat, body aches, and fatigue for one week prior to urgent care visit - Strep test performed at urgent care was negative - Prescribed prednisone  (course completed) and Augmentin (currently taking) - Symptoms described as fluctuating, with ongoing occasional cough, especially after exposure to irritants such as bug spray - Sore throat persists, particularly after exposure to strong chemicals - Continues to feel feverish with hot and cold sensations - Breathing remains stable with no reported shortness of breath - No chest pain or palpitations  Neurological and musculoskeletal symptoms - Tingling in fingers - Unexplained leg pain, primarily in the left leg  Medication effects and management - Prednisone  causes increased heart rate and stomach upset if not taken with adequate food - Efforts to maintain hydration and nutrition during illness  Functional status and work absence - Out of work since March 19, 2024, due to illness - Initially planned to return to work on March 23, 2024, but continued to feel unwell - Plans to return to work on March 26, 2024 - Awaiting FMLA paperwork completion for work  absence         11/15/2023   11:39 AM 11/15/2023   11:10 AM 05/08/2023    9:02 AM  Depression screen PHQ 2/9  Decreased Interest 0 0 0  Down, Depressed, Hopeless 0 0 0  PHQ - 2 Score 0 0 0  Altered sleeping 0 0   Tired, decreased energy 0 0   Change in appetite 0 0   Feeling bad or failure about yourself  0 0   Trouble concentrating 0 0   Moving slowly or fidgety/restless 0 0   Suicidal thoughts 0 0   PHQ-9 Score 0 0   Difficult doing work/chores Not difficult at all Not difficult at all     Relevant past medical, surgical, family and social history reviewed and updated as indicated. Interim medical history since our last visit reviewed. Allergies and medications reviewed and updated.  Review of Systems  Ten systems reviewed and is negative except as mentioned in HPI      Objective:      BP 122/78   Pulse 98   Resp 18   Ht 5' 10 (1.778 m)   Wt 177 lb 1.6 oz (80.3 kg)   SpO2 98%   BMI 25.41 kg/m    Wt Readings from Last 3 Encounters:  03/26/24 177 lb 1.6 oz (80.3 kg)  01/17/24 172 lb (78 kg)  11/15/23 175 lb 1.6 oz (79.4 kg)    Physical Exam GENERAL: Alert, cooperative, well developed,  no acute distress HEENT: Normocephalic, normal oropharynx, moist mucous membranes CHEST: Clear to auscultation bilaterally, no wheezes, rhonchi, or crackles CARDIOVASCULAR: Normal heart rate and rhythm, S1 and S2 normal without murmurs ABDOMEN: Soft, non-tender, non-distended, without organomegaly, normal bowel sounds EXTREMITIES: No cyanosis or edema NEUROLOGICAL: Cranial nerves grossly intact, moves all extremities without gross motor or sensory deficit  Results for orders placed or performed during the hospital encounter of 01/17/24  Basic metabolic panel   Collection Time: 01/17/24  5:22 PM  Result Value Ref Range   Sodium 136 135 - 145 mmol/L   Potassium 4.1 3.5 - 5.1 mmol/L   Chloride 101 98 - 111 mmol/L   CO2 26 22 - 32 mmol/L   Glucose, Bld 85 70 - 99 mg/dL   BUN  12 6 - 20 mg/dL   Creatinine, Ser 8.90 0.61 - 1.24 mg/dL   Calcium 9.1 8.9 - 89.6 mg/dL   GFR, Estimated >39 >39 mL/min   Anion gap 9 5 - 15  CBC   Collection Time: 01/17/24  5:22 PM  Result Value Ref Range   WBC 3.1 (L) 4.0 - 10.5 K/uL   RBC 4.39 4.22 - 5.81 MIL/uL   Hemoglobin 13.6 13.0 - 17.0 g/dL   HCT 60.3 60.9 - 47.9 %   MCV 90.2 80.0 - 100.0 fL   MCH 31.0 26.0 - 34.0 pg   MCHC 34.3 30.0 - 36.0 g/dL   RDW 87.3 88.4 - 84.4 %   Platelets 198 150 - 400 K/uL   nRBC 0.0 0.0 - 0.2 %  Troponin I (High Sensitivity)   Collection Time: 01/17/24  5:22 PM  Result Value Ref Range   Troponin I (High Sensitivity) <2 <18 ng/L          Assessment & Plan:   Problem List Items Addressed This Visit   None Visit Diagnoses       Viral upper respiratory tract infection    -  Primary     Encounter for completion of form with patient            Assessment and Plan Assessment & Plan Acute upper respiratory infection Acute upper respiratory infection with symptoms of fever, chills, sore throat, body ache, fatigue, and intermittent cough persisting for over a week. Negative strep test. Differential includes COVID-19 and influenza, but no testing was performed. Symptoms have fluctuated with some improvement. Lungs are clear on examination. Reports increased sensitivity to smells and occasional cough after exposure to irritants. - Continue Augmentin as prescribed. - Ensure adequate hydration.  FMLA paperwork FMLA paperwork required for work absence starting 03/19/2024 due to acute illness. Forms to be faxed from employer. Plans to return to work today without needing a work clearance note unless issues arise. - Complete FMLA paperwork upon receipt of forms from employer. - Provide a note for work clearance if necessary.        Follow up plan: Return if symptoms worsen or fail to improve.

## 2024-03-27 ENCOUNTER — Other Ambulatory Visit: Payer: Self-pay

## 2024-03-27 ENCOUNTER — Emergency Department

## 2024-03-27 ENCOUNTER — Emergency Department
Admission: EM | Admit: 2024-03-27 | Discharge: 2024-03-27 | Disposition: A | Discharge status: Home / Self Care | Attending: Emergency Medicine | Admitting: Emergency Medicine

## 2024-03-27 DIAGNOSIS — J45901 Unspecified asthma with (acute) exacerbation: Secondary | ICD-10-CM | POA: Insufficient documentation

## 2024-03-27 DIAGNOSIS — J680 Bronchitis and pneumonitis due to chemicals, gases, fumes and vapors: Secondary | ICD-10-CM

## 2024-03-27 DIAGNOSIS — R0602 Shortness of breath: Secondary | ICD-10-CM | POA: Diagnosis present

## 2024-03-27 LAB — BASIC METABOLIC PANEL WITH GFR
Anion gap: 12 (ref 5–15)
BUN: 21 mg/dL — ABNORMAL HIGH (ref 6–20)
CO2: 25 mmol/L (ref 22–32)
Calcium: 9.6 mg/dL (ref 8.9–10.3)
Chloride: 100 mmol/L (ref 98–111)
Creatinine, Ser: 1.07 mg/dL (ref 0.61–1.24)
GFR, Estimated: >60 mL/min (ref >60)
Glucose, Bld: 123 mg/dL — ABNORMAL HIGH (ref 70–99)
Potassium: 3.8 mmol/L (ref 3.5–5.1)
Sodium: 137 mmol/L (ref 135–145)

## 2024-03-27 LAB — CBC
HCT: 41 % (ref 39.0–52.0)
Hemoglobin: 14.1 g/dL (ref 13.0–17.0)
MCH: 30.5 pg (ref 26.0–34.0)
MCHC: 34.4 g/dL (ref 30.0–36.0)
MCV: 88.7 fL (ref 80.0–100.0)
Platelets: 241 K/uL (ref 150–400)
RBC: 4.62 MIL/uL (ref 4.22–5.81)
RDW: 12.7 % (ref 11.5–15.5)
WBC: 7.4 K/uL (ref 4.0–10.5)
nRBC: 0 % (ref 0.0–0.2)

## 2024-03-27 MED ORDER — ALBUTEROL SULFATE HFA 108 (90 BASE) MCG/ACT IN AERS
2.0000 | INHALATION_SPRAY | Freq: Once | RESPIRATORY_TRACT | Status: AC
Start: 2024-03-27 — End: 2024-03-27
  Administered 2024-03-27: 2 via RESPIRATORY_TRACT
  Filled 2024-03-27: qty 6.7
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
## 2024-03-27 NOTE — Discharge Instructions (Addendum)
 Please use your albuterol  inhaler 1 to 2 puffs every 2 hours or so as needed for shortness of breath.  Return to the emergency department for any significant worsening of shortness of breath any chest pain or any other symptom concerning to yourself.
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
## 2024-04-23 ENCOUNTER — Telehealth: Payer: Self-pay | Admitting: Nurse Practitioner

## 2024-04-23 NOTE — Telephone Encounter (Unsigned)
 Copied from CRM [id number]. Topic: General - Other >> Apr 19, 2024  1:53 PM Delon DASEN wrote: Reason for CRM: FMLA paperwork was not filled out properly, needs it redone to show that an asthma attack could happen at any time- [phone]

## 2024-04-24 ENCOUNTER — Encounter: Payer: Self-pay | Admitting: Pulmonary Disease

## 2024-04-24 ENCOUNTER — Other Ambulatory Visit: Payer: Self-pay

## 2024-04-24 DIAGNOSIS — J4540 Moderate persistent asthma, uncomplicated: Secondary | ICD-10-CM

## 2024-05-09 ENCOUNTER — Emergency Department

## 2024-05-09 ENCOUNTER — Other Ambulatory Visit: Payer: Self-pay

## 2024-05-09 ENCOUNTER — Encounter: Payer: Self-pay | Admitting: Emergency Medicine

## 2024-05-09 DIAGNOSIS — R079 Chest pain, unspecified: Secondary | ICD-10-CM | POA: Insufficient documentation

## 2024-05-09 DIAGNOSIS — Z5321 Procedure and treatment not carried out due to patient leaving prior to being seen by health care provider: Secondary | ICD-10-CM | POA: Insufficient documentation

## 2024-05-09 DIAGNOSIS — R202 Paresthesia of skin: Secondary | ICD-10-CM | POA: Diagnosis not present

## 2024-05-09 DIAGNOSIS — R531 Weakness: Secondary | ICD-10-CM | POA: Diagnosis not present

## 2024-05-09 DIAGNOSIS — J029 Acute pharyngitis, unspecified: Secondary | ICD-10-CM | POA: Insufficient documentation

## 2024-05-09 DIAGNOSIS — R0981 Nasal congestion: Secondary | ICD-10-CM | POA: Diagnosis present

## 2024-05-09 LAB — BASIC METABOLIC PANEL WITH GFR
Anion gap: 8 (ref 5–15)
BUN: 14 mg/dL (ref 6–20)
CO2: 26 mmol/L (ref 22–32)
Calcium: 9 mg/dL (ref 8.9–10.3)
Chloride: 102 mmol/L (ref 98–111)
Creatinine, Ser: 1.1 mg/dL (ref 0.61–1.24)
GFR, Estimated: >60 mL/min (ref >60)
Glucose, Bld: 96 mg/dL (ref 70–99)
Potassium: 3.7 mmol/L (ref 3.5–5.1)
Sodium: 136 mmol/L (ref 135–145)

## 2024-05-09 LAB — TROPONIN I (HIGH SENSITIVITY): Troponin I (High Sensitivity): 3 ng/L (ref <18)

## 2024-05-09 LAB — CBC
HCT: 37.7 % — ABNORMAL LOW (ref 39.0–52.0)
Hemoglobin: 13.3 g/dL (ref 13.0–17.0)
MCH: 31.4 pg (ref 26.0–34.0)
MCHC: 35.3 g/dL (ref 30.0–36.0)
MCV: 88.9 fL (ref 80.0–100.0)
Platelets: 187 K/uL (ref 150–400)
RBC: 4.24 MIL/uL (ref 4.22–5.81)
RDW: 12.4 % (ref 11.5–15.5)
WBC: 3.3 K/uL — ABNORMAL LOW (ref 4.0–10.5)
nRBC: 0 % (ref 0.0–0.2)

## 2024-05-09 LAB — GROUP A STREP BY PCR: Group A Strep by PCR: NOT DETECTED

## 2024-05-09 NOTE — ED Triage Notes (Addendum)
 Pt arrives ambulatory to triage, gait steady, no acute distress noted c/o of congestion,sore throat, intermittent BUE tingling x 2-3 days. Tonight at work pt felt like his congestion was worse and experienced some BLE weakness, also had chest pain w/ left arm tingling that has subsided. Pt reports difficulty taking deep breath. Denies n/v/d

## 2024-05-10 ENCOUNTER — Emergency Department
Admission: EM | Admit: 2024-05-10 | Discharge: 2024-05-10 | Discharge status: Against Medical Advice | Attending: Emergency Medicine | Admitting: Emergency Medicine

## 2024-05-10 LAB — RESP PANEL BY RT-PCR (RSV, FLU A&B, COVID)  RVPGX2
Influenza A by PCR: NEGATIVE
Influenza B by PCR: NEGATIVE
Resp Syncytial Virus by PCR: NEGATIVE
SARS Coronavirus 2 by RT PCR: NEGATIVE

## 2024-05-10 NOTE — ED Notes (Signed)
 Pt called for lab work and vital sign recheck, no answer at this time

## 2024-06-20 ENCOUNTER — Emergency Department

## 2024-06-20 ENCOUNTER — Encounter: Payer: Self-pay | Admitting: Intensive Care

## 2024-06-20 ENCOUNTER — Other Ambulatory Visit: Payer: Self-pay

## 2024-06-20 ENCOUNTER — Emergency Department
Admission: EM | Admit: 2024-06-20 | Discharge: 2024-06-21 | Disposition: A | Discharge status: Home / Self Care | Attending: Emergency Medicine | Admitting: Emergency Medicine

## 2024-06-20 DIAGNOSIS — R079 Chest pain, unspecified: Secondary | ICD-10-CM | POA: Insufficient documentation

## 2024-06-20 DIAGNOSIS — J45909 Unspecified asthma, uncomplicated: Secondary | ICD-10-CM | POA: Insufficient documentation

## 2024-06-20 LAB — TROPONIN T, HIGH SENSITIVITY
Troponin T High Sensitivity: <15 ng/L (ref 0–19)
Troponin T High Sensitivity: <15 ng/L (ref 0–19)

## 2024-06-20 LAB — BASIC METABOLIC PANEL WITH GFR
Anion gap: 11 (ref 5–15)
BUN: 16 mg/dL (ref 6–20)
CO2: 25 mmol/L (ref 22–32)
Calcium: 9.2 mg/dL (ref 8.9–10.3)
Chloride: 102 mmol/L (ref 98–111)
Creatinine, Ser: 1.1 mg/dL (ref 0.61–1.24)
GFR, Estimated: >60 mL/min (ref >60)
Glucose, Bld: 91 mg/dL (ref 70–99)
Potassium: 4.2 mmol/L (ref 3.5–5.1)
Sodium: 137 mmol/L (ref 135–145)

## 2024-06-20 LAB — CBC
HCT: 39.6 % (ref 39.0–52.0)
Hemoglobin: 13.4 g/dL (ref 13.0–17.0)
MCH: 30.4 pg (ref 26.0–34.0)
MCHC: 33.8 g/dL (ref 30.0–36.0)
MCV: 89.8 fL (ref 80.0–100.0)
Platelets: 196 K/uL (ref 150–400)
RBC: 4.41 MIL/uL (ref 4.22–5.81)
RDW: 12.7 % (ref 11.5–15.5)
WBC: 3.7 K/uL — ABNORMAL LOW (ref 4.0–10.5)
nRBC: 0 % (ref 0.0–0.2)

## 2024-06-20 NOTE — ED Triage Notes (Signed)
 Patient c/o chest pain that started around 2:30am. Radiation to left arm

## 2024-06-20 NOTE — ED Provider Notes (Signed)
 [facility] Provider Note    Event Date/Time   First MD Initiated Contact with Patient 06/20/24 2334     (approximate)  History   Chief Complaint: Chest Pain  HPI  Gary Nash is a 58 y.o. male with a past medical history of asthma, gastric reflux, presents to the emergency department for chest pain.  According to the patient he works second shift.  States he went home from work had a bowl of chili and then laid down approximate hour later began experiencing pain in his chest.  Patient thought it could just be his reflux so he tried to wait it out and it seemed to improve.  Patient states he then went to work today and while working he felt the pain started to come back so he saw the nurse at his work who checked his blood pressure and said it was a little elevated.  They were concerned so they had the patient come to the emergency department.  Here the patient appears well he denies any pain whatsoever.  Patient does have a history of heartburn.  Denies any cardiac history.  Physical Exam   Triage Vital Signs: ED Triage Vitals  Encounter Vitals Group     BP 06/20/24 1817 128/68     Girls Systolic BP Percentile --      Girls Diastolic BP Percentile --      Boys Systolic BP Percentile --      Boys Diastolic BP Percentile --      Pulse Rate 06/20/24 1817 60     Resp 06/20/24 1817 18     Temp 06/20/24 1817 98.1 F (36.7 C)     Temp Source 06/20/24 1817 Oral     SpO2 06/20/24 1817 99 %     Weight 06/20/24 1829 175 lb (79.4 kg)     Height 06/20/24 1829 5' 10 (1.778 m)     Head Circumference --      Peak Flow --      Pain Score 06/20/24 1829 3     Pain Loc --      Pain Education --      Exclude from Growth Chart --     Most recent vital signs: Vitals:   06/20/24 1817 06/20/24 2105  BP: 128/68 130/79  Pulse: 60 (!) 55  Resp: 18 18  Temp: 98.1 F (36.7 C) 97.7 F (36.5 C)  SpO2: 99% 100%    General: Awake, no distress.  CV:  Good peripheral  perfusion.  Regular rate and rhythm  Resp:  Normal effort.  Equal breath sounds bilaterally.  Abd:  No distention.  Soft, nontender.  No rebound or guarding. ED Results / Procedures / Treatments   EKG  EKG viewed and interpreted by myself shows sinus bradycardia 51 bpm with a narrow QRS, normal axis, normal intervals, no concerning ST changes.  RADIOLOGY  I have reviewed interpret the chest x-ray images.  No consolidation based on my evaluation.  Radiology is read the x-ray is negative.   MEDICATIONS ORDERED IN ED: Medications - No data to display   IMPRESSION / MDM / ASSESSMENT AND PLAN / ED COURSE  I reviewed the triage vital signs and the nursing notes.  Patient's presentation is most consistent with acute presentation with potential threat to life or bodily function.  Patient presents to the emergency department for chest pain.  Started last night around 2 AM (24 hours ago) went away but then came back earlier today  around 3 PM.  Has gone away again.  Patient denies any pain currently.  The pain started after eating a bowl of chili which he states was spicy and normally he avoid spicy foods due to gastric reflux.  Symptoms seem suggestive that it could be more reflux related.  Patient's workup in the emergency department is reassuring with a normal physical exam reassuring vitals normal chest x-ray reassuring EKG reassuring labs including normal CBC chemistry and negative troponin x 2.  Given the patient's reassuring workup we will discharge with outpatient follow-up.  Recommended over-the-counter Maalox before bed each night for the next several nights.  Patient agreeable to plan also discussed my typical chest pain return precautions.  FINAL CLINICAL IMPRESSION(S) / ED DIAGNOSES   chest pain   Note:  This document was prepared using Dragon voice recognition software and may include unintentional dictation errors.   Dorothyann Drivers, MD 06/20/24 [phone]

## 2024-07-27 ENCOUNTER — Other Ambulatory Visit: Payer: Self-pay

## 2024-07-27 ENCOUNTER — Emergency Department
Admission: EM | Admit: 2024-07-27 | Discharge: 2024-07-27 | Disposition: A | Discharge status: Home / Self Care | Attending: Emergency Medicine | Admitting: Emergency Medicine

## 2024-07-27 ENCOUNTER — Emergency Department

## 2024-07-27 DIAGNOSIS — G43809 Other migraine, not intractable, without status migrainosus: Secondary | ICD-10-CM | POA: Insufficient documentation

## 2024-07-27 DIAGNOSIS — R202 Paresthesia of skin: Secondary | ICD-10-CM | POA: Diagnosis present

## 2024-07-27 LAB — COMPREHENSIVE METABOLIC PANEL WITH GFR
ALT: 12 U/L (ref 0–44)
AST: 15 U/L (ref 15–41)
Albumin: 4.4 g/dL (ref 3.5–5.0)
Alkaline Phosphatase: 48 U/L (ref 38–126)
Anion gap: 10 (ref 5–15)
BUN: 17 mg/dL (ref 6–20)
CO2: 25 mmol/L (ref 22–32)
Calcium: 9.7 mg/dL (ref 8.9–10.3)
Chloride: 103 mmol/L (ref 98–111)
Creatinine, Ser: 0.99 mg/dL (ref 0.61–1.24)
GFR, Estimated: >60 mL/min
Glucose, Bld: 91 mg/dL (ref 70–99)
Potassium: 3.4 mmol/L — ABNORMAL LOW (ref 3.5–5.1)
Sodium: 138 mmol/L (ref 135–145)
Total Bilirubin: 0.4 mg/dL (ref 0.0–1.2)
Total Protein: 7.6 g/dL (ref 6.5–8.1)

## 2024-07-27 LAB — DIFFERENTIAL
Abs Immature Granulocytes: 0.01 K/uL (ref 0.00–0.07)
Basophils Absolute: 0 K/uL (ref 0.0–0.1)
Basophils Relative: 0 %
Eosinophils Absolute: 0 K/uL (ref 0.0–0.5)
Eosinophils Relative: 0 %
Immature Granulocytes: 0 %
Lymphocytes Relative: 33 %
Lymphs Abs: 2.3 K/uL (ref 0.7–4.0)
Monocytes Absolute: 0.7 K/uL (ref 0.1–1.0)
Monocytes Relative: 9 %
Neutro Abs: 4 K/uL (ref 1.7–7.7)
Neutrophils Relative %: 58 %

## 2024-07-27 LAB — CBC
HCT: 39.7 % (ref 39.0–52.0)
Hemoglobin: 14.1 g/dL (ref 13.0–17.0)
MCH: 31.5 pg (ref 26.0–34.0)
MCHC: 35.5 g/dL (ref 30.0–36.0)
MCV: 88.6 fL (ref 80.0–100.0)
Platelets: 214 K/uL (ref 150–400)
RBC: 4.48 MIL/uL (ref 4.22–5.81)
RDW: 12.6 % (ref 11.5–15.5)
WBC: 7 K/uL (ref 4.0–10.5)
nRBC: 0 % (ref 0.0–0.2)

## 2024-07-27 LAB — PROTIME-INR
INR: 0.9 (ref 0.8–1.2)
Prothrombin Time: 12.9 s (ref 11.4–15.2)

## 2024-07-27 LAB — APTT: aPTT: 26 s (ref 24–36)

## 2024-07-27 LAB — ETHANOL: Alcohol, Ethyl (B): <15 mg/dL

## 2024-07-27 LAB — CBG MONITORING, ED: Glucose-Capillary: 82 mg/dL (ref 70–99)

## 2024-07-27 MED ORDER — KETOROLAC TROMETHAMINE 30 MG/ML IJ SOLN
15.0000 mg | Freq: Once | INTRAMUSCULAR | Status: AC
  Administered 2024-07-27: 15 mg via INTRAVENOUS
  Filled 2024-07-27: qty 1

## 2024-07-27 MED ORDER — PROCHLORPERAZINE EDISYLATE 10 MG/2ML IJ SOLN
10.0000 mg | Freq: Once | INTRAMUSCULAR | Status: AC
  Administered 2024-07-27: 10 mg via INTRAVENOUS
  Filled 2024-07-27: qty 2

## 2024-07-27 MED ORDER — LACTATED RINGERS IV BOLUS
1000.0000 mL | Freq: Once | INTRAVENOUS | Status: AC
  Administered 2024-07-27: 1000 mL via INTRAVENOUS

## 2024-07-27 MED ORDER — ACETAMINOPHEN 500 MG PO TABS
1000.0000 mg | ORAL_TABLET | Freq: Once | ORAL | Status: AC
  Administered 2024-07-27: 1000 mg via ORAL
  Filled 2024-07-27: qty 2

## 2024-07-27 MED ORDER — DIPHENHYDRAMINE HCL 50 MG/ML IJ SOLN
25.0000 mg | Freq: Once | INTRAMUSCULAR | Status: DC
  Filled 2024-07-27: qty 1

## 2024-07-27 NOTE — ED Notes (Signed)
 Pt ambulatory to waiting room. Pt verbalized understanding of discharge instructions.

## 2024-07-27 NOTE — ED Notes (Signed)
 PO challenge given

## 2024-07-27 NOTE — ED Provider Notes (Signed)
 "  [facility] Provider Note    Event Date/Time   First MD Initiated Contact with Patient 07/27/24 1026     (approximate)   History   Numbness   HPI  Gary Nash is a 59 y.o. male who presents to the ED for evaluation of Numbness   Review a PCP visit from 4 days ago where patient was seen for cough, congestion , URI.  Discharged with doxycycline.  Baseline history of GERD, anxiety.  Patient presents to the ED with right-sided tingling and right sided headache over the past 12 hours.  Reports some symptoms developed last night around 8 PM after eating some spicy food, actually drove himself to the ER parking lot but his symptoms seem to improve so he drove himself back home.  This morning he reports worsening tingling and sensation of numbness in the right side of his whole body, face, arms, trunk and legs alongside pressure and headache on the right side of his head.  No trauma, falls or syncope, fevers.  Reports blurry vision at times when he tries to look at his phone, but no blurriness when looking around the room.  Physical Exam   Triage Vital Signs: ED Triage Vitals  Encounter Vitals Group     BP 07/27/24 0957 (!) 173/98     Girls Systolic BP Percentile --      Girls Diastolic BP Percentile --      Boys Systolic BP Percentile --      Boys Diastolic BP Percentile --      Pulse Rate 07/27/24 0957 65     Resp 07/27/24 0957 16     Temp 07/27/24 0957 97.8 F (36.6 C)     Temp src --      SpO2 07/27/24 0957 100 %     Weight 07/27/24 0958 175 lb 0.7 oz (79.4 kg)     Height --      Head Circumference --      Peak Flow --      Pain Score 07/27/24 0958 0     Pain Loc --      Pain Education --      Exclude from Growth Chart --     Most recent vital signs: Vitals:   07/27/24 1100 07/27/24 1130  BP: (!) 144/83 (!) 154/86  Pulse: (!) 49 (!) 53  Resp: 13 15  Temp:    SpO2: 100% 100%    General: Awake, no distress.  CV:  Good peripheral  perfusion.  Resp:  Normal effort.  Abd:  No distention.  MSK:  No deformity noted.  Neuro:  No focal deficits appreciated. Cranial nerves II through XII intact 5/5 strength and sensation in all 4 extremities -reports abnormal sensation throughout the right side of his body Other:     ED Results / Procedures / Treatments   Labs (all labs ordered are listed, but only abnormal results are displayed) Labs Reviewed  COMPREHENSIVE METABOLIC PANEL WITH GFR - Abnormal; Notable for the following components:      Result Value   Potassium 3.4 (*)    All other components within normal limits  PROTIME-INR  APTT  CBC  DIFFERENTIAL  ETHANOL  CBG MONITORING, ED    EKG Sinus rhythm with a rate of 54 bpm.  Normal axis and intervals.  No signs of acute ischemia.  Inverted T waves to lead III, consistent with previous EKGs from last month.  No acute changes  RADIOLOGY CT head  interpreted by me without evidence of acute intracranial pathology  Official radiology report(s): CT HEAD WO CONTRAST Result Date: 07/27/2024 EXAM: CT HEAD WITHOUT CONTRAST 07/27/2024 11:09:15 AM TECHNIQUE: CT of the head was performed without the administration of intravenous contrast. Automated exposure control, iterative reconstruction, and/or weight based adjustment of the mA/kV was utilized to reduce the radiation dose to as low as reasonably achievable. COMPARISON: CT of the head dated 01/02/2023. CLINICAL HISTORY: Neuro deficit, acute, stroke suspected. FINDINGS: BRAIN AND VENTRICLES: No acute hemorrhage. No evidence of acute infarct. No hydrocephalus. No extra-axial collection. No mass effect or midline shift. ORBITS: No acute abnormality. SINUSES: No acute abnormality. SOFT TISSUES AND SKULL: No acute soft tissue abnormality. No skull fracture. IMPRESSION: 1. No acute intracranial abnormality. No acute territorial infarct, intracranial hemorrhage, or mass effect. Electronically signed by: Evalene Coho MD 07/27/2024  11:15 AM EST RP Workstation: [id number]    PROCEDURES and INTERVENTIONS:  Procedures  Medications  diphenhydrAMINE  (BENADRYL ) injection 25 mg (25 mg Intravenous Patient Refused/Not Given 07/27/24 1131)  ketorolac  (TORADOL ) 30 MG/ML injection 15 mg (15 mg Intravenous Given 07/27/24 1128)  acetaminophen  (TYLENOL ) tablet 1,000 mg (1,000 mg Oral Given 07/27/24 1129)  lactated ringers  bolus 1,000 mL (1,000 mLs Intravenous New Bag/Given 07/27/24 1128)  prochlorperazine  (COMPAZINE ) injection 10 mg (10 mg Intravenous Given 07/27/24 1129)     IMPRESSION / MDM / ASSESSMENT AND PLAN / ED COURSE  I reviewed the triage vital signs and the nursing notes.  Differential diagnosis includes, but is not limited to, ICH, ischemic stroke, electrolyte derangement, migraine, peripheral neuropathy  {Patient presents with symptoms of an acute illness or injury that is potentially life-threatening.  59 year old with minimal risk factors presents with headache and right sided sensation changes.  CT head without ICH or clear CVA.  Will treat his headache with a migraine cocktail and reassess.  No deficits that I can appreciate on exam.  Marginal hypokalemia, normal CBC.   Clinical Course as of 07/27/24 1353  Sat Jul 27, 2024  1236 reassessed [DS]  1349 Reassessed, symptoms have resolved and he is asymptomatic.  I give him some water  and we discussed his workup.  We discussed outpatient management and ED return precautions.  He is appreciative [DS]    Clinical Course User Index [DS] Claudene Rover, MD     FINAL CLINICAL IMPRESSION(S) / ED DIAGNOSES   Final diagnoses:  Other migraine without status migrainosus, not intractable  Paresthesia     Rx / DC Orders   ED Discharge Orders     None        Note:  This document was prepared using Dragon voice recognition software and may include unintentional dictation errors.   Claudene Rover, MD 07/27/24 1353  "

## 2024-07-27 NOTE — ED Notes (Addendum)
 Pt reports last night around 2000 he had a headache after eating out and around 2030 he had right sided numbness. Pt states he felt dizzy and nauseous while taking a shower, reports some difficulty finding words yesterday and some difficulty with walking. Pt currently reports headache, decreased sensation in right face, right arm, and right leg. Pt states he can feel weakness in right side of mouth when smiling, no visble asymmetry noted, speech is clear. Pt denies V/D, SHOB, CP. No history of stroke or cardiac problems- reports GERD.

## 2024-07-27 NOTE — ED Triage Notes (Addendum)
 C/o right sided numbness since last night at around 2030. States ate something 'bad or too spicy' that made him have a headache, took 2 aspirin  (325 mg ea) for headache and then numbness started.  Presents today with c/o continued numbness.  Also c/o feeling dizzy last night in bed when eyes were closed. Denies current c/o dizziness.  AAOx3. Skin warm and dry. Speech clear. Facial movements equal. MAE equally and strong.  States right face with less sensation than left.  Right arm with more sensation than left and equal sensation bilaterally to legs.

## 2024-07-27 NOTE — Discharge Instructions (Addendum)
 Please take Tylenol  and ibuprofen/Advil for your pain.  It is safe to take them together, or to alternate them every few hours.  Take up to 1000mg  of Tylenol  at a time, up to 4 times per day.  Do not take more than 4000 mg of Tylenol  in 24 hours.  For ibuprofen, take 400-600 mg, 3 - 4 times per day.  Return to the ED with any worsening symptoms despite these measures or with any other strokelike symptoms

## 2024-07-29 ENCOUNTER — Telehealth: Payer: Self-pay

## 2024-07-29 DIAGNOSIS — K219 Gastro-esophageal reflux disease without esophagitis: Secondary | ICD-10-CM

## 2024-07-29 MED ORDER — OMEPRAZOLE 20 MG PO CPDR: 20.0000 mg | DELAYED_RELEASE_CAPSULE | Freq: Every day | ORAL | 0 refills | Status: AC

## 2024-07-29 NOTE — Telephone Encounter (Signed)
 Copied from CRM [id number]. Topic: Clinical - Medication Refill >> Jul 29, 2024  2:44 PM Nathanel BROCKS wrote: Medication: omeprazole  (PRILOSEC) 20 MG capsule  Has the patient contacted their pharmacy? No   This is the patient's preferred pharmacy:  [id number] - [place], [place] - [address] [place] [address] Phone: [phone] Fax: [phone]  Is this the correct pharmacy for this prescription? Yes If no, delete pharmacy and type the correct one.   Has the prescription been filled recently? Yes  Is the patient out of the medication? Yes  Has the patient been seen for an appointment in the last year OR does the patient have an upcoming appointment? Yes  Can we respond through MyChart? No  Agent: Please be advised that Rx refills may take up to 3 business days. We ask that you follow-up with your pharmacy.

## 2024-07-31 ENCOUNTER — Other Ambulatory Visit: Payer: Self-pay

## 2024-07-31 ENCOUNTER — Encounter: Payer: Self-pay | Admitting: Emergency Medicine

## 2024-07-31 ENCOUNTER — Emergency Department: Admission: EM | Admit: 2024-07-31 | Discharge: 2024-08-01 | Disposition: A | Discharge status: Home / Self Care

## 2024-07-31 ENCOUNTER — Emergency Department

## 2024-07-31 DIAGNOSIS — Z7901 Long term (current) use of anticoagulants: Secondary | ICD-10-CM | POA: Diagnosis not present

## 2024-07-31 DIAGNOSIS — I82621 Acute embolism and thrombosis of deep veins of right upper extremity: Secondary | ICD-10-CM | POA: Insufficient documentation

## 2024-07-31 DIAGNOSIS — I809 Phlebitis and thrombophlebitis of unspecified site: Secondary | ICD-10-CM | POA: Insufficient documentation

## 2024-07-31 DIAGNOSIS — J45909 Unspecified asthma, uncomplicated: Secondary | ICD-10-CM | POA: Diagnosis not present

## 2024-07-31 DIAGNOSIS — Z79899 Other long term (current) drug therapy: Secondary | ICD-10-CM | POA: Insufficient documentation

## 2024-07-31 DIAGNOSIS — M7989 Other specified soft tissue disorders: Secondary | ICD-10-CM | POA: Diagnosis present
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
## 2024-07-31 NOTE — ED Notes (Signed)
Patient in Ultrasound at this time

## 2024-07-31 NOTE — ED Provider Notes (Signed)
 "  [facility] Provider Note    Event Date/Time   First MD Initiated Contact with Patient 07/31/24 1921     (approximate)   History   Arm Pain   HPI  Gary Nash is a 59 y.o. male history of GERD, asthma, presents emergency department complaining of redness and swelling to the right arm where an IV was placed.  States they tried to stick him in that area and had to move the needle around quite a bit.  Now there is a red streak on his arm, swollen area and he is concerned he could have a blood clot or air bubble.  Denies chest pain or shortness of breath.  No fever or chills.      Physical Exam   Triage Vital Signs: ED Triage Vitals  Encounter Vitals Group     BP 07/31/24 1851 (!) 160/82     Girls Systolic BP Percentile --      Girls Diastolic BP Percentile --      Boys Systolic BP Percentile --      Boys Diastolic BP Percentile --      Pulse Rate 07/31/24 1851 63     Resp 07/31/24 1851 16     Temp 07/31/24 1851 98.2 F (36.8 C)     Temp Source 07/31/24 1851 Oral     SpO2 07/31/24 1851 98 %     Weight 07/31/24 1850 174 lb 2.6 oz (79 kg)     Height 07/31/24 1850 5' 10 (1.778 m)     Head Circumference --      Peak Flow --      Pain Score 07/31/24 1850 8     Pain Loc --      Pain Education --      Exclude from Growth Chart --     Most recent vital signs: Vitals:   07/31/24 1851  BP: (!) 160/82  Pulse: 63  Resp: 16  Temp: 98.2 F (36.8 C)  SpO2: 98%     General: Awake, no distress.   CV:  Good peripheral perfusion.  Resp:  Normal effort. Abd:  No distention.   Other:  Right forearm with mild redness swelling noted along the vein where an IV was inserted, small streak noted   ED Results / Procedures / Treatments   Labs (all labs ordered are listed, but only abnormal results are displayed) Labs Reviewed - No data to display   EKG     RADIOLOGY Ultrasound right upper extremity for  DVT    PROCEDURES:   Procedures  Critical Care:  no Chief Complaint  Patient presents with   Arm Pain      MEDICATIONS ORDERED IN ED: Medications - No data to display   IMPRESSION / MDM / ASSESSMENT AND PLAN / ED COURSE  I reviewed the triage vital signs and the nursing notes.                              Differential diagnosis includes, but is not limited to, phlebitis, DVT, cellulitis  Patient's presentation is most consistent with acute illness / injury with system symptoms.   Ultrasound right upper extremity for DVT    Care transferred to Tinnie Hudson, PA-C at shift change  FINAL CLINICAL IMPRESSION(S) / ED DIAGNOSES   Final diagnoses:  Phlebitis     Rx / DC Orders   ED Discharge Orders  None        Note:  This document was prepared using Dragon voice recognition software and may include unintentional dictation errors.    Gasper Devere ORN, PA-C 07/31/24 2108    Rexford Reche HERO, MD 07/31/24 [phone]  "

## 2024-08-01 MED ORDER — APIXABAN (ELIQUIS) VTE STARTER PACK (10MG AND 5MG): ORAL_TABLET | ORAL | 0 refills | Status: DC

## 2024-08-01 NOTE — Discharge Instructions (Signed)
 The ultrasound of your right arm showed that you have a blood clot.  This is treated by taking Eliquis .  You will take two 5 mg tablets twice a day for 1 week.  After 7 days you will take 1 tablet twice a day.  Please follow-up with your primary care provider.

## 2024-08-01 NOTE — ED Provider Notes (Signed)
 ----------------------------------------- 12:06 AM on 08/01/2024 -----------------------------------------  Blood pressure (!) 160/82, pulse 63, temperature 98.2 F (36.8 C), temperature source Oral, resp. rate 16, height 5' 10 (1.778 m), weight 79 kg, SpO2 98%.  Assuming care from Devere Perry, PA-C.  In short, Gary Nash is a 59 y.o. male with a chief complaint of Arm Pain .  Refer to the original H&P for additional details.  The current plan of care is to wait for results of ultrasound and dispo accordingly.  ____________________________________________    ED Results / Procedures / Treatments   Labs (all labs ordered are listed, but only abnormal results are displayed) Labs Reviewed - No data to display   RADIOLOGY  I personally viewed and evaluated these images as part of my medical decision making, as well as reviewing the written report by the radiologist.  ED Provider Interpretation: Images show an occlusive thrombus in the brachial veins and cephalic vein, partial thrombus of the proximal ulnar vein.  US  Venous Img Upper Uni Right(DVT) Result Date: 07/31/2024 EXAM: US  Duplex right Upper Extremity Veins. TECHNIQUE: Real-time ultrasound scan of the veins of the right upper extremity with color Doppler flow, spectral waveform analysis and compression. COMPARISON: None available. CLINICAL HISTORY: 144615 Pain 144615 Pain FINDINGS: SUPERFICIAL VEINS: The cephalic vein has occlusive thrombus and is noncompressible. The basilic vein is compressible, and demonstrates normal color Doppler flow. DEEP VEINS: The internal jugular, subclavian, and axillary veins are compressible, and demonstrate normal color Doppler flow. Occlusive thrombus is noted in 1 of the brachial veins in the mid and distal upper arm, which is noncompressible. Partial thrombosis of the proximal ulnar vein is noted, which is partially compressible. SOFT TISSUES: No acute finding. IMPRESSION: 1. Occlusive thrombus  in one of the brachial veins in the mid and distal upper arm and in the cephalic vein. 2. Partial thrombosis of the proximal ulnar vein. Electronically signed by: Franky Crease MD 07/31/2024 11:22 PM EST RP Workstation: [id number]     PROCEDURES:  Critical Care performed: No  Procedures   MEDICATIONS ORDERED IN ED: Medications - No data to display   IMPRESSION / MDM / ASSESSMENT AND PLAN / ED COURSE  I reviewed the triage vital signs and the nursing notes.                             See original providers note for differential diagnosis.  Patient's presentation is most consistent with acute complicated illness / injury requiring diagnostic workup.  Ultrasound positive for DVT.  Will get patient started on Eliquis  and advised to follow-up with primary care provider.  Reviewed risks benefits and alternatives of taking Eliquis  with the patient.  Discussed ED return precautions including worsening symptoms concerning for PE as well as bleeding risks with Eliquis .  Patient voiced understanding, all questions were answered and he was stable at discharge.   Clinical Course as of 08/01/24 0006  Wed Jul 31, 2024  2107 Received sign out from Devere Perry PA-C, suspect thrombophlebitis, will obtain ultrasound and discharge if reassuring. [LD]  2209 Patient eloped before he got the ultrasound.  [LD]  2306 Patient has returned, I was unaware that he was taken to ultrasound, assumed he left because he was tired of waiting. [LD]  2328 US  Venous Img Upper Uni Right(DVT) Positive for DVT.  IMPRESSION: 1. Occlusive thrombus in one of the brachial veins in the mid and distal upper arm and in the cephalic vein. 2.  Partial thrombosis of the proximal ulnar vein.   [LD]    Clinical Course User Index [LD] Cleaster Tinnie LABOR, PA-C    FINAL CLINICAL IMPRESSION(S) / ED DIAGNOSES   Final diagnoses:  Acute deep vein thrombosis (DVT) of brachial vein of right upper extremity (HCC)     Rx / DC  Orders   ED Discharge Orders          Ordered    APIXABAN  (ELIQUIS ) VTE STARTER PACK (10MG  AND 5MG )       Note to Pharmacy: If starter pack unavailable, substitute with seventy-four 5 mg apixaban  tabs following the above SIG directions.   08/01/24 0000             Note:  This document was prepared using Dragon voice recognition software and may include unintentional dictation errors.    Cleaster Tinnie LABOR, PA-C 08/01/24 0008    Waymond Lorelle Cummins, MD 08/02/24 [phone]

## 2024-08-02 ENCOUNTER — Encounter: Payer: Self-pay | Admitting: Nurse Practitioner

## 2024-08-02 ENCOUNTER — Ambulatory Visit: Admitting: Nurse Practitioner

## 2024-08-02 VITALS — BP 130/60 | HR 53 | Temp 97.5°F | Ht 70.0 in | Wt 174.0 lb

## 2024-08-02 DIAGNOSIS — J4520 Mild intermittent asthma, uncomplicated: Secondary | ICD-10-CM

## 2024-08-02 DIAGNOSIS — I82621 Acute embolism and thrombosis of deep veins of right upper extremity: Secondary | ICD-10-CM | POA: Insufficient documentation

## 2024-08-02 DIAGNOSIS — G43709 Chronic migraine without aura, not intractable, without status migrainosus: Secondary | ICD-10-CM | POA: Diagnosis not present

## 2024-08-02 DIAGNOSIS — F330 Major depressive disorder, recurrent, mild: Secondary | ICD-10-CM

## 2024-08-02 DIAGNOSIS — R1319 Other dysphagia: Secondary | ICD-10-CM | POA: Diagnosis not present

## 2024-08-02 DIAGNOSIS — F419 Anxiety disorder, unspecified: Secondary | ICD-10-CM | POA: Diagnosis not present

## 2024-08-02 DIAGNOSIS — K219 Gastro-esophageal reflux disease without esophagitis: Secondary | ICD-10-CM | POA: Diagnosis not present

## 2024-08-02 MED ORDER — APIXABAN 5 MG PO TABS: 5.0000 mg | ORAL_TABLET | Freq: Two times a day (BID) | ORAL | 0 refills | Status: DC

## 2024-08-02 MED ORDER — AIRSUPRA 90-80 MCG/ACT IN AERO: 1.0000 | INHALATION_SPRAY | RESPIRATORY_TRACT | 5 refills | Status: AC | PRN

## 2024-08-02 NOTE — Progress Notes (Signed)
 "  BP 130/60   Pulse (!) 53   Temp (!) 97.5 F (36.4 C)   Ht 5' 10 (1.778 m)   Wt 174 lb (78.9 kg)   SpO2 96%   BMI 24.97 kg/m    Subjective:    Patient ID: Gary Nash, male    DOB: 1966-05-16, 59 y.o.   MRN: [id number]  HPI: Gary Nash is a 59 y.o. male  Chief Complaint  Patient presents with   Medical Management of Chronic Issues   Discussed the use of AI scribe software for clinical note transcription with the patient, who gave verbal consent to proceed.  History of Present Illness Gary Nash is a 59 year old male who presents for a routine follow-up and medication refill.  Right upper extremity deep vein thrombosis - Diagnosed with DVT in the right arm on July 31, 2024 after emergency department evaluation - Swelling present in the right arm at the site of previous IV insertion - Ultrasound revealed occlusive thrombus in the brachial veins and partial thrombosis of the proximal ulnar vein - Started Eliquis  the night before the visit and is following prescribed dosing schedule - Pain in the right arm has decreased but persists  Asthma - Requires refill of inhaler (Air Supra)  Gastroesophageal reflux disease and dysphagia - Currently taking omeprazole  20 mg daily - Difficulty swallowing  Anxiety and depression - No longer taking Lexapro  - No current symptoms of anxiety or depression; screening is negative  Migraine headache - Experienced severe migraine on July 27, 2024 requiring emergency room visit - Symptoms included headache, dizziness, visual disturbances (seeing dots, difficulty focusing) - Previous similar but less intense episode - CT scan of head was normal - Migraine symptoms resolved after ER treatment  Sinusitis - History of sinus issues - Currently taking amoxicillin for sinus infection    EXAM: US  Duplex right Upper Extremity Veins.   TECHNIQUE: Real-time ultrasound scan of the veins of the right upper extremity with  color Doppler flow, spectral waveform analysis and compression.   COMPARISON: None available.   CLINICAL HISTORY: 144615 Pain 144615 Pain   FINDINGS:   SUPERFICIAL VEINS: The cephalic vein has occlusive thrombus and is noncompressible. The basilic vein is compressible, and demonstrates normal color Doppler flow.   DEEP VEINS: The internal jugular, subclavian, and axillary veins are compressible, and demonstrate normal color Doppler flow. Occlusive thrombus is noted in 1 of the brachial veins in the mid and distal upper arm, which is noncompressible. Partial thrombosis of the proximal ulnar vein is noted, which is partially compressible.   SOFT TISSUES: No acute finding.   IMPRESSION: 1. Occlusive thrombus in one of the brachial veins in the mid and distal upper arm and in the cephalic vein. 2. Partial thrombosis of the proximal ulnar vein.   EXAM: CT HEAD WITHOUT CONTRAST 07/27/2024 11:09:15 AM   TECHNIQUE: CT of the head was performed without the administration of intravenous contrast. Automated exposure control, iterative reconstruction, and/or weight based adjustment of the mA/kV was utilized to reduce the radiation dose to as low as reasonably achievable.   COMPARISON: CT of the head dated 01/02/2023.   CLINICAL HISTORY: Neuro deficit, acute, stroke suspected.   FINDINGS:   BRAIN AND VENTRICLES: No acute hemorrhage. No evidence of acute infarct. No hydrocephalus. No extra-axial collection. No mass effect or midline shift.   ORBITS: No acute abnormality.   SINUSES: No acute abnormality.   SOFT TISSUES AND SKULL: No acute soft tissue abnormality.  No skull fracture.   IMPRESSION: 1. No acute intracranial abnormality. No acute territorial infarct, intracranial hemorrhage, or mass effect.     08/02/2024   11:35 AM 11/15/2023   11:39 AM 11/15/2023   11:10 AM  Depression screen PHQ 2/9  Decreased Interest 1 0 0  Down, Depressed, Hopeless 1 0 0  PHQ -  2 Score 2 0 0  Altered sleeping 1 0 0  Tired, decreased energy 1 0 0  Change in appetite 1 0 0  Feeling bad or failure about yourself  1 0 0  Trouble concentrating 1 0 0  Moving slowly or fidgety/restless 0 0 0  Suicidal thoughts 0 0 0  PHQ-9 Score 7 0  0   Difficult doing work/chores  Not difficult at all Not difficult at all     Data saved with a previous flowsheet row definition    Relevant past medical, surgical, family and social history reviewed and updated as indicated. Interim medical history since our last visit reviewed. Allergies and medications reviewed and updated.  Review of Systems  Constitutional: Negative for fever or weight change.  Respiratory: Negative for cough and shortness of breath.   Cardiovascular: Negative for chest pain or palpitations.  Gastrointestinal: Negative for abdominal pain, no bowel changes.  Musculoskeletal: Negative for gait problem or joint swelling.  Skin: Negative for rash.  Neurological: Negative for dizziness or headache.  No other specific complaints in a complete review of systems (except as listed in HPI above).      Objective:      BP 130/60   Pulse (!) 53   Temp (!) 97.5 F (36.4 C)   Ht 5' 10 (1.778 m)   Wt 174 lb (78.9 kg)   SpO2 96%   BMI 24.97 kg/m    Wt Readings from Last 3 Encounters:  08/02/24 174 lb (78.9 kg)  07/31/24 174 lb 2.6 oz (79 kg)  07/27/24 175 lb 0.7 oz (79.4 kg)    Physical Exam GENERAL: Alert, cooperative, well developed, no acute distress HEENT: Normocephalic, normal oropharynx, moist mucous membranes CHEST: Clear to auscultation bilaterally, No wheezes, rhonchi, or crackles CARDIOVASCULAR: Normal heart rate and rhythm, S1 and S2 normal without murmurs ABDOMEN: Soft, non-tender, non-distended, without organomegaly, Normal bowel sounds EXTREMITIES: No cyanosis or edema NEUROLOGICAL: Cranial nerves grossly intact, Moves all extremities without gross motor or sensory deficit  Results for  orders placed or performed during the hospital encounter of 07/27/24  CBG monitoring, ED   Collection Time: 07/27/24  9:59 AM  Result Value Ref Range   Glucose-Capillary 82 70 - 99 mg/dL  Protime-INR   Collection Time: 07/27/24 10:16 AM  Result Value Ref Range   Prothrombin Time 12.9 11.4 - 15.2 seconds   INR 0.9 0.8 - 1.2  APTT   Collection Time: 07/27/24 10:16 AM  Result Value Ref Range   aPTT 26 24 - 36 seconds  CBC   Collection Time: 07/27/24 10:16 AM  Result Value Ref Range   WBC 7.0 4.0 - 10.5 K/uL   RBC 4.48 4.22 - 5.81 MIL/uL   Hemoglobin 14.1 13.0 - 17.0 g/dL   HCT 60.2 60.9 - 47.9 %   MCV 88.6 80.0 - 100.0 fL   MCH 31.5 26.0 - 34.0 pg   MCHC 35.5 30.0 - 36.0 g/dL   RDW 87.3 88.4 - 84.4 %   Platelets 214 150 - 400 K/uL   nRBC 0.0 0.0 - 0.2 %  Differential   Collection Time: 07/27/24 10:16  AM  Result Value Ref Range   Neutrophils Relative % 58 %   Neutro Abs 4.0 1.7 - 7.7 K/uL   Lymphocytes Relative 33 %   Lymphs Abs 2.3 0.7 - 4.0 K/uL   Monocytes Relative 9 %   Monocytes Absolute 0.7 0.1 - 1.0 K/uL   Eosinophils Relative 0 %   Eosinophils Absolute 0.0 0.0 - 0.5 K/uL   Basophils Relative 0 %   Basophils Absolute 0.0 0.0 - 0.1 K/uL   Immature Granulocytes 0 %   Abs Immature Granulocytes 0.01 0.00 - 0.07 K/uL  Comprehensive metabolic panel   Collection Time: 07/27/24 10:16 AM  Result Value Ref Range   Sodium 138 135 - 145 mmol/L   Potassium 3.4 (L) 3.5 - 5.1 mmol/L   Chloride 103 98 - 111 mmol/L   CO2 25 22 - 32 mmol/L   Glucose, Bld 91 70 - 99 mg/dL   BUN 17 6 - 20 mg/dL   Creatinine, Ser 9.00 0.61 - 1.24 mg/dL   Calcium 9.7 8.9 - 89.6 mg/dL   Total Protein 7.6 6.5 - 8.1 g/dL   Albumin 4.4 3.5 - 5.0 g/dL   AST 15 15 - 41 U/L   ALT 12 0 - 44 U/L   Alkaline Phosphatase 48 38 - 126 U/L   Total Bilirubin 0.4 0.0 - 1.2 mg/dL   GFR, Estimated >39 >39 mL/min   Anion gap 10 5 - 15  Ethanol   Collection Time: 07/27/24 10:16 AM  Result Value Ref Range    Alcohol, Ethyl (B) <15 <15 mg/dL          Assessment & Plan:   Problem List Items Addressed This Visit       Cardiovascular and Mediastinum   Chronic migraine without aura without status migrainosus, not intractable   Relevant Medications   apixaban  (ELIQUIS ) 5 MG TABS tablet   Other Relevant Orders   Ambulatory referral to Neurology   Acute deep vein thrombosis (DVT) of brachial vein of right upper extremity (HCC)   Relevant Medications   apixaban  (ELIQUIS ) 5 MG TABS tablet     Respiratory   Mild intermittent asthma without complication - Primary   Relevant Medications   Albuterol -Budesonide (AIRSUPRA ) 90-80 MCG/ACT AERO     Digestive   Gastroesophageal reflux disease without esophagitis   Esophageal dysphagia     Other   Anxiety   Mild episode of recurrent major depressive disorder     Assessment and Plan Assessment & Plan Acute deep vein thrombosis of right upper extremity Diagnosed with DVT in the right upper extremity following an IV placement. Ultrasound showed occlusive thrombus in the brachial vein and partial thrombosis of the proximal ulnar vein. The clot is provoked due to the IV placement, necessitating a short course of anticoagulation. He is aware of the bleeding risk associated with Eliquis  and understands the importance of avoiding trauma and certain medications that increase bleeding risk. - Continue Eliquis  for three months from the episode. - Refilled Eliquis  for two additional months. - Advised to avoid ibuprofen, Advil, Motrin, naproxen , and Aleve . - Advised to seek emergency care if bleeding does not stop after 30 minutes of pressure. - Advised to avoid trauma, especially to the head.  Mild intermittent asthma Requires a refill of his inhaler. - Refilled Air Supra inhaler.   Chronic migraine Reports a complicated migraine with neurological symptoms, including visual disturbances and dizziness. Previous CT of the head was normal. Symptoms  improved with a migraine cocktail in  the ER. - Referred to neurology for further evaluation and management of migraines and potential nerve issues.  Major depressive disorder, recurrent, mild No longer taking Lexapro . Depression and anxiety screen is negative, indicating well-managed symptoms without medication.  Anxiety disorder Anxiety screen is negative, indicating well-managed symptoms without medication.        Follow up plan: Return if symptoms worsen or fail to improve. "

## 2024-08-05 ENCOUNTER — Ambulatory Visit: Payer: Self-pay

## 2024-08-05 ENCOUNTER — Emergency Department

## 2024-08-05 ENCOUNTER — Other Ambulatory Visit: Payer: Self-pay

## 2024-08-05 ENCOUNTER — Emergency Department
Admission: EM | Admit: 2024-08-05 | Discharge: 2024-08-05 | Disposition: A | Discharge status: Home / Self Care | Attending: Emergency Medicine | Admitting: Emergency Medicine

## 2024-08-05 ENCOUNTER — Encounter: Payer: Self-pay | Admitting: Emergency Medicine

## 2024-08-05 DIAGNOSIS — I82A11 Acute embolism and thrombosis of right axillary vein: Secondary | ICD-10-CM | POA: Insufficient documentation

## 2024-08-05 DIAGNOSIS — R079 Chest pain, unspecified: Secondary | ICD-10-CM | POA: Diagnosis present

## 2024-08-05 DIAGNOSIS — Z7901 Long term (current) use of anticoagulants: Secondary | ICD-10-CM | POA: Diagnosis not present

## 2024-08-05 LAB — CBC
HCT: 37.9 % — ABNORMAL LOW (ref 39.0–52.0)
Hemoglobin: 13.1 g/dL (ref 13.0–17.0)
MCH: 31.2 pg (ref 26.0–34.0)
MCHC: 34.6 g/dL (ref 30.0–36.0)
MCV: 90.2 fL (ref 80.0–100.0)
Platelets: 189 10*3/uL (ref 150–400)
RBC: 4.2 MIL/uL — ABNORMAL LOW (ref 4.22–5.81)
RDW: 13.1 % (ref 11.5–15.5)
WBC: 4.1 10*3/uL (ref 4.0–10.5)
nRBC: 0 % (ref 0.0–0.2)

## 2024-08-05 LAB — BASIC METABOLIC PANEL WITH GFR
Anion gap: 7 (ref 5–15)
BUN: 15 mg/dL (ref 6–20)
CO2: 26 mmol/L (ref 22–32)
Calcium: 8.6 mg/dL — ABNORMAL LOW (ref 8.9–10.3)
Chloride: 105 mmol/L (ref 98–111)
Creatinine, Ser: 1.05 mg/dL (ref 0.61–1.24)
GFR, Estimated: >60 mL/min
Glucose, Bld: 97 mg/dL (ref 70–99)
Potassium: 3.9 mmol/L (ref 3.5–5.1)
Sodium: 138 mmol/L (ref 135–145)

## 2024-08-05 LAB — PROTIME-INR
INR: 1 (ref 0.8–1.2)
Prothrombin Time: 13.6 s (ref 11.4–15.2)

## 2024-08-05 LAB — PRO BRAIN NATRIURETIC PEPTIDE: Pro Brain Natriuretic Peptide: <50 pg/mL

## 2024-08-05 LAB — TROPONIN T, HIGH SENSITIVITY
Troponin T High Sensitivity: 6 ng/L (ref 0–19)
Troponin T High Sensitivity: <6 ng/L (ref 0–19)

## 2024-08-05 MED ORDER — DABIGATRAN ETEXILATE MESYLATE 150 MG PO CAPS
150.0000 mg | ORAL_CAPSULE | Freq: Once | ORAL | Status: AC
  Administered 2024-08-05: 150 mg via ORAL
  Filled 2024-08-05: qty 1

## 2024-08-05 MED ORDER — DOXYCYCLINE HYCLATE 100 MG PO TABS: 100.0000 mg | ORAL_TABLET | Freq: Two times a day (BID) | ORAL | 0 refills | Status: DC

## 2024-08-05 MED ORDER — IOHEXOL 350 MG/ML SOLN
75.0000 mL | Freq: Once | INTRAVENOUS | Status: AC | PRN
  Administered 2024-08-05: 75 mL via INTRAVENOUS

## 2024-08-05 MED ORDER — DABIGATRAN ETEXILATE MESYLATE 150 MG PO CAPS: 150.0000 mg | ORAL_CAPSULE | Freq: Two times a day (BID) | ORAL | 0 refills | Status: DC

## 2024-08-05 NOTE — ED Triage Notes (Addendum)
 Pt via POV c/o central nonradiating CP since last night, recently diagnosed with blood clot in right arm and started on eliquis , stopped taking on Saturday after only a couple of days. Pt had SOB earlier which has resolved. Pain rated 4/10 worse with movement.

## 2024-08-05 NOTE — Telephone Encounter (Signed)
 FYI Only or Action Required?: FYI only for provider: ED advised.  Patient was last seen in primary care on 08/02/2024 by Gareth Mliss FALCON, FNP.  Called Nurse Triage reporting Chest Pain.  Symptoms began yesterday.  Interventions attempted: Nothing.  Symptoms are: stable.  Triage Disposition: Go to ED Now (Notify PCP)  Patient/caregiver understands and will follow disposition?: Yes       Reason for Disposition  Difficulty breathing  Answer Assessment - Initial Assessment Questions Patient reports after 3 days of taking ELIQUIS  started  getting tingling both hands , and then starting hurting really bad , and dry skin. Stopped taking the ELIQUIS   after Saturday . After stopping the ELIQUIS . Yesterday when scrapping the windows of snow had trouble breathing and chest pain this did not stop after shoveling the snow it went on  , and  lasted 8 hours. Advised patient to return to ER given concerns clot may have moved which is outlined in ER discharge paperwork as well and  given the shortness of breath and chest pain patient agreeable and family taking him to ER to be seen. FYI back to office    1. LOCATION: Where does it hurt?       Chest  2. RADIATION: Does the pain go anywhere else? (e.g., into neck, jaw, arms, back)     Not reported  3. ONSET: When did the chest pain begin? (Minutes, hours or days)      Yesterday after shoveling snow lasted 8 hours 4. PATTERN: Does the pain come and go, or has it been constant since it started?  Does it get worse with exertion?      Constant 8 hours 5. DURATION: How long does it last (e.g., seconds, minutes, hours)     8 hours   7. CARDIAC RISK FACTORS: Do you have any history of heart problems or risk factors for heart disease? (e.g., angina, prior heart attack; diabetes, high blood pressure, high cholesterol, smoker, or strong family history of heart disease)     Seen ER 1/21 for blood clot, stopped blood thinner this past  Saturday  Patient denies the following hemoptysis  Protocols used: Chest Pain-A-AH   Message from Ipava G sent at 08/05/2024  2:09 PM EST  Reason for Triage: left hand feels numb and feels off since starting new med apixaban  (ELIQUIS ) 5 MG TABS tablet.

## 2024-08-05 NOTE — ED Notes (Signed)
 Pt transported to Xray.

## 2024-08-05 NOTE — ED Provider Notes (Signed)
 "  [facility] Provider Note   Event Date/Time   First MD Initiated Contact with Patient 08/05/24 1506     (approximate) History  Chest Pain  HPI Gary Nash is a 59 y.o. male with a recent diagnosed right upper extremity DVT who presents after a reaction while starting Eliquis , stopping this Eliquis , and developing chest pain today.  Patient states that after the second day of taking this medication, he developed bilateral hand tingling and mild numbness.  Patient states that after stopping this medication the symptoms have resolved.  Patient also states that while shoveling snow this morning he began having chest pain that is lasted for approximately 8 hours.  Patient denies any other exacerbating or relieving factors for the symptoms. ROS: Patient currently denies any vision changes, tinnitus, difficulty speaking, facial droop, sore throat, abdominal pain, nausea/vomiting/diarrhea, dysuria, or weakness/numbness/paresthesias in any extremity   Physical Exam  Triage Vital Signs: ED Triage Vitals  Encounter Vitals Group     BP 08/05/24 1450 135/76     Girls Systolic BP Percentile --      Girls Diastolic BP Percentile --      Boys Systolic BP Percentile --      Boys Diastolic BP Percentile --      Pulse Rate 08/05/24 1450 70     Resp 08/05/24 1450 16     Temp 08/05/24 1450 97.9 F (36.6 C)     Temp Source 08/05/24 1450 Oral     SpO2 08/05/24 1450 100 %     Weight 08/05/24 1447 174 lb (78.9 kg)     Height 08/05/24 1447 5' 10 (1.778 m)     Head Circumference --      Peak Flow --      Pain Score 08/05/24 1447 4     Pain Loc --      Pain Education --      Exclude from Growth Chart --    Most recent vital signs: Vitals:   08/05/24 1450  BP: 135/76  Pulse: 70  Resp: 16  Temp: 97.9 F (36.6 C)  SpO2: 100%   General: Awake, oriented x4. CV:  Good peripheral perfusion. Resp:  Normal effort. Abd:  No distention. Other:  Well-developed, well-nourished  middle-aged African-American male resting comfortably in no acute distress ED Results / Procedures / Treatments  Labs (all labs ordered are listed, but only abnormal results are displayed) Labs Reviewed  BASIC METABOLIC PANEL WITH GFR - Abnormal; Notable for the following components:      Result Value   Calcium 8.6 (*)    All other components within normal limits  CBC - Abnormal; Notable for the following components:   RBC 4.20 (*)    HCT 37.9 (*)    All other components within normal limits  PROTIME-INR  PRO BRAIN NATRIURETIC PEPTIDE  TROPONIN T, HIGH SENSITIVITY  TROPONIN T, HIGH SENSITIVITY   EKG ED ECG REPORT I, Artist MARLA Kerns, the attending physician, personally viewed and interpreted this ECG. Date: 08/05/2024 EKG Time: 1451 Rate: 62 Rhythm: normal sinus rhythm QRS Axis: normal Intervals: normal ST/T Wave abnormalities: normal Narrative Interpretation: no evidence of acute ischemia RADIOLOGY ED MD interpretation: 2 view chest x-ray interpreted by me shows no evidence of acute abnormalities including no pneumonia, pneumothorax, or widened mediastinum - All radiology independently interpreted and agree with radiology assessment Official radiology report(s): CT Angio Chest PE W/Cm &/Or Wo Cm Result Date: 08/05/2024 EXAM: CTA of the Chest with contrast  for PE 08/05/2024 03:57:54 PM TECHNIQUE: CTA of the chest was performed after the administration of 75 mL of iohexol  (OMNIPAQUE ) 350 MG/ML injection. Multiplanar reformatted images are provided for review. MIP images are provided for review. Automated exposure control, iterative reconstruction, and/or weight based adjustment of the mA/kV was utilized to reduce the radiation dose to as low as reasonably achievable. COMPARISON: Chest radiograph 08/05/2024 and CT chest 12/06/2016. CLINICAL HISTORY: Pulmonary embolism (PE) suspected, high probability. Non-radiating chest pain since last night. Recently diagnosed with a blood clot in the  right arm and started on eliquis . FINDINGS: LIMITATIONS/ARTIFACTS: Mild motion artifact. PULMONARY ARTERIES: Pulmonary arteries are adequately opacified for evaluation. No pulmonary embolism. Main pulmonary artery is normal in caliber. MEDIASTINUM: The heart and pericardium demonstrate no acute abnormality. Normal heart size. No pericardial effusions. There is no acute abnormality of the thoracic aorta. Normal caliber thoracic aorta. No aortic dissection. The thyroid  gland is unremarkable. The esophagus is decompressed. Gas collection adjacent to the mid esophagus may represent tortuous esophagus or mid-esophageal diverticulum. No pneumomediastinum. LYMPH NODES: No mediastinal, hilar or axillary lymphadenopathy. LUNGS AND PLEURA: The lungs are without acute process. Lungs are clear. No focal consolidation or pulmonary edema. No pleural effusion or pneumothorax. UPPER ABDOMEN: Limited images of the upper abdomen are unremarkable. No acute abnormalities demonstrated in the visualized upper abdomen. SOFT TISSUES AND BONES: Degenerative changes in the spine. No acute bony abnormalities. No acute soft tissue abnormality. IMPRESSION: 1. No pulmonary embolism. 2. No acute pulmonary abnormality. Electronically signed by: Elsie Gravely MD 08/05/2024 04:21 PM EST RP Workstation: [id number]   DG Chest 2 View Result Date: 08/05/2024 EXAM: 2 VIEW(S) XRAY OF THE CHEST 08/05/2024 03:16:07 PM COMPARISON: 06/20/2024 CLINICAL HISTORY: Chest pain. FINDINGS: LUNGS AND PLEURA: No focal pulmonary opacity. No pleural effusion. No pneumothorax. HEART AND MEDIASTINUM: Aortic atherosclerosis. No acute abnormality of the cardiac and mediastinal silhouettes. BONES AND SOFT TISSUES: Multilevel thoracic osteophytosis. No acute fracture or destructive lesion. IMPRESSION: 1. No acute cardiopulmonary pathology. 2. Aortic atherosclerosis. Electronically signed by: Elsie Gravely MD 08/05/2024 03:31 PM EST RP Workstation: [id number]    PROCEDURES: Critical Care performed: No Procedures MEDICATIONS ORDERED IN ED: Medications  iohexol  (OMNIPAQUE ) 350 MG/ML injection 75 mL (75 mLs Intravenous Contrast Given 08/05/24 1549)  dabigatran  (PRADAXA ) capsule 150 mg (150 mg Oral Given 08/05/24 1727)   IMPRESSION / MDM / ASSESSMENT AND PLAN / ED COURSE  I reviewed the triage vital signs and the nursing notes.                             The patient is on the cardiac monitor to evaluate for evidence of arrhythmia and/or significant heart rate changes. Patient's presentation is most consistent with acute presentation with potential threat to life or bodily function. Patient a 59 year old male with the above-stated past medical history including recently diagnosed right upper extremity DVT and noncompliance of his anticoagulation after adverse effects presents complaining of chest pain and shortness of breath while working today.  DDx: PE, medication side effect, ACS, CHF Plan: CBC, BMP, INR, BNP, troponin, CT PE, EKG  Patient's laboratory and radiologic evaluations not show any evidence of acute abnormalities specifically no evidence of PE.  Patient's troponin is also normal and therefore have low suspicion for ACS at this time.  Upon reassessment, patient is now complaining of worsening upper respiratory symptoms including productive cough of green sputum and requesting antibiotics.  Patient will be given empiric course of  doxycycline  and instructions to follow-up with his primary care physician for further evaluation and management if symptoms persist.  Will change patient's outpatient anticoagulation from Eliquis  to Pradaxa  and patient is in agreement.  Patient given strict return precautions and all questions were answered prior to discharge.  Dispo: Discharge home with PCP follow-up as needed   FINAL CLINICAL IMPRESSION(S) / ED DIAGNOSES   Final diagnoses:  Acute deep vein thrombosis (DVT) of axillary vein of right upper  extremity (HCC)  Chest pain, unspecified type   Rx / DC Orders   ED Discharge Orders          Ordered    dabigatran  (PRADAXA ) 150 MG CAPS capsule  2 times daily        08/05/24 1700    doxycycline  (VIBRA -TABS) 100 MG tablet  2 times daily        08/05/24 1743           Note:  This document was prepared using Dragon voice recognition software and may include unintentional dictation errors.   Aric Jost K, MD 08/05/24 1745  "

## 2024-08-06 ENCOUNTER — Emergency Department
Admission: EM | Admit: 2024-08-06 | Discharge: 2024-08-07 | Disposition: A | Attending: Emergency Medicine | Admitting: Emergency Medicine

## 2024-08-06 ENCOUNTER — Emergency Department

## 2024-08-06 ENCOUNTER — Other Ambulatory Visit: Payer: Self-pay

## 2024-08-06 ENCOUNTER — Encounter: Payer: Self-pay | Admitting: *Deleted

## 2024-08-06 DIAGNOSIS — T45515A Adverse effect of anticoagulants, initial encounter: Secondary | ICD-10-CM | POA: Diagnosis not present

## 2024-08-06 DIAGNOSIS — R519 Headache, unspecified: Secondary | ICD-10-CM | POA: Insufficient documentation

## 2024-08-06 DIAGNOSIS — J45909 Unspecified asthma, uncomplicated: Secondary | ICD-10-CM | POA: Diagnosis not present

## 2024-08-06 DIAGNOSIS — Z7901 Long term (current) use of anticoagulants: Secondary | ICD-10-CM | POA: Diagnosis not present

## 2024-08-06 DIAGNOSIS — T50905A Adverse effect of unspecified drugs, medicaments and biological substances, initial encounter: Secondary | ICD-10-CM

## 2024-08-06 LAB — BASIC METABOLIC PANEL WITH GFR
Anion gap: 9 (ref 5–15)
BUN: 17 mg/dL (ref 6–20)
CO2: 23 mmol/L (ref 22–32)
Calcium: 8.8 mg/dL — ABNORMAL LOW (ref 8.9–10.3)
Chloride: 104 mmol/L (ref 98–111)
Creatinine, Ser: 0.93 mg/dL (ref 0.61–1.24)
GFR, Estimated: >60 mL/min
Glucose, Bld: 108 mg/dL — ABNORMAL HIGH (ref 70–99)
Potassium: 4.2 mmol/L (ref 3.5–5.1)
Sodium: 137 mmol/L (ref 135–145)

## 2024-08-06 LAB — CBC
HCT: 38.3 % — ABNORMAL LOW (ref 39.0–52.0)
Hemoglobin: 13.1 g/dL (ref 13.0–17.0)
MCH: 31 pg (ref 26.0–34.0)
MCHC: 34.2 g/dL (ref 30.0–36.0)
MCV: 90.8 fL (ref 80.0–100.0)
Platelets: 206 10*3/uL (ref 150–400)
RBC: 4.22 MIL/uL (ref 4.22–5.81)
RDW: 13 % (ref 11.5–15.5)
WBC: 4.3 10*3/uL (ref 4.0–10.5)
nRBC: 0 % (ref 0.0–0.2)

## 2024-08-06 NOTE — ED Triage Notes (Signed)
 Pt ambulatory to triage.  Pt took 1 pradaxa  at 1700.  Soon afterwards, pt started having blurred vision and difficulty focusing.  No rash.  Pt was seen in er and started on meds for blood clot in right arm. No headache  No chest pain or sob.  No acute resp distress.  Pt alert  speech clear.

## 2024-08-07 MED ORDER — RIVAROXABAN (XARELTO) VTE STARTER PACK (15 & 20 MG): ORAL_TABLET | ORAL | 0 refills | Status: DC

## 2024-08-07 MED ORDER — ACETAMINOPHEN 500 MG PO TABS
1000.0000 mg | ORAL_TABLET | Freq: Once | ORAL | Status: AC
  Administered 2024-08-07: 1000 mg via ORAL
  Filled 2024-08-07: qty 2

## 2024-08-07 MED ORDER — ACETAMINOPHEN 500 MG PO TABS
ORAL_TABLET | ORAL | Status: AC
  Administered 2024-08-07: 1000 mg via ORAL
  Filled 2024-08-07: qty 1

## 2024-08-07 NOTE — ED Provider Notes (Signed)
 "  [facility] Provider Note    Event Date/Time   First MD Initiated Contact with Patient 08/07/24 0016     (approximate)   History   Chief Complaint: Allergic Reaction   HPI  Gary Nash is a 59 y.o. male with a history of dysphagia, sciatica, GERD who comes ED complaining of blurred vision, right sided headache, feeling confused starting about 30 minutes after taking his first dose of Pradaxa  for right upper extremity DVT.  He was started on Eliquis  3 or 4 days ago, and yesterday came back to the ED due to a brief episode of chest pain and hand paresthesia that started after the patient was outside scraping ice off of car windshield in very cold temperatures.  He thought the symptoms might be related to Eliquis  allergy so he called his doctor's office, nursing triage line told him to go to the ED.  In the ED, CT chest was negative for PE or other acute findings, workup was overall reassuring.  He was prescribed Pradaxa  since he was no longer willing to take Eliquis .        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  Took medicinesfor a couple of years.   Sciatica of right side     Current Outpatient Rx   Order #: [id number] Class: Normal   Order #: [id number] Class: Normal   Order #: [id number] Class: Historical Med   Order #: [id number] Class: Normal    Past Surgical History:  Procedure Laterality Date   COLONOSCOPY WITH PROPOFOL  N/A 06/16/2023   Procedure: COLONOSCOPY WITH PROPOFOL ;  Surgeon: Jinny Carmine, MD;  Location: [facility];  Service: Endoscopy;  Laterality: N/A;   ESOPHAGOGASTRODUODENOSCOPY (EGD) WITH PROPOFOL  N/A 06/16/2023   Procedure:  ESOPHAGOGASTRODUODENOSCOPY (EGD) WITH PROPOFOL ;  Surgeon: Jinny Carmine, MD;  Location: [facility];  Service: Endoscopy;  Laterality: N/A;   WISDOM TOOTH EXTRACTION  2022    Physical Exam   Triage Vital Signs: ED Triage Vitals  Encounter Vitals Group     BP 08/06/24 1953 125/80     Girls Systolic BP Percentile --      Girls Diastolic BP Percentile --      Boys Systolic BP Percentile --      Boys Diastolic BP Percentile --      Pulse Rate 08/06/24 1953 68     Resp 08/06/24 1953 18     Temp 08/06/24 1953 98.2 F (36.8 C)     Temp Source 08/06/24 1953 Oral     SpO2 08/06/24 1953 98 %     Weight 08/06/24 1954 175 lb (79.4 kg)     Height 08/06/24 1954 5' 10 (1.778 m)     Head Circumference --      Peak Flow --      Pain Score 08/06/24 1954 6     Pain Loc --      Pain Education --      Exclude from Growth Chart --     Most recent vital signs: Vitals:   08/06/24 1953  BP: 125/80  Pulse: 68  Resp: 18  Temp: 98.2 F (36.8 C)  SpO2: 98%    General: Awake, no distress.  CV:  Good peripheral perfusion.  Regular rate rhythm Resp:  Normal effort.  Clear lungs Abd:  No distention.  Soft nontender Other:  No lower extremity edema, no calf tenderness.  Nontoxic, ambulatory   ED Results / Procedures / Treatments   Labs (all labs ordered are listed, but only abnormal results are displayed) Labs Reviewed  BASIC METABOLIC PANEL WITH GFR - Abnormal; Notable for the following components:      Result Value   Glucose, Bld 108 (*)    Calcium 8.8 (*)    All other components within normal limits  CBC - Abnormal; Notable for the following components:   HCT 38.3 (*)    All other components within normal limits     EKG Interpreted by me Sinus bradycardia rate of 45.  Normal axis intervals QRS ST segments T waves   RADIOLOGY CT head interpreted by me, unremarkable.  Radiology report reviewed   PROCEDURES:  Procedures   MEDICATIONS ORDERED IN ED: Medications   acetaminophen  (TYLENOL ) tablet 1,000 mg (1,000 mg Oral Given 08/07/24 0113)     IMPRESSION / MDM / ASSESSMENT AND PLAN / ED COURSE  I reviewed the triage vital signs and the nursing notes.  DDx: Tension headache, intracranial hemorrhage, medication intolerance, paresthesias related to cold weather and vasoconstriction, anxiety  Patient's presentation is most consistent with acute presentation with potential threat to life or bodily function.     Clinical Course as of 08/07/24 0646  Wed Aug 07, 2024  0031 Xarelto  rx [PS]  [id number] Presents with headache and right facial paresthesia starting 5:30 PM which he relates to having just taken his first dose of Pradaxa .  Had previously been on Eliquis  for the last 3 days for DVT but discontinued it when he started getting hand paresthesias on the third day of taking it which occurred while scraping I saw from his car windshield and freezing windy weather.  Patient is not comfortable with continuing Pradaxa  or going back to Eliquis .  Will DC Pradaxa , start Xarelto .  No sign of any allergic/hypersensitivity reaction.  Vitals are normal, doubt stroke, intracranial hemorrhage, PE, paradoxical embolism, dural sinus thrombosis. [PS]    Clinical Course User Index [PS] Viviann Pastor, MD     FINAL CLINICAL IMPRESSION(S) / ED DIAGNOSES   Final diagnoses:  Adverse effect of drug, initial encounter     Rx / DC Orders   ED Discharge Orders          Ordered    RIVAROXABAN  (XARELTO ) VTE STARTER PACK (15 & 20 MG)        08/07/24 0234             Note:  This document was prepared using Dragon voice recognition software and may include unintentional dictation errors.   Viviann Pastor, MD 08/07/24 [id number]  "

## 2024-08-08 ENCOUNTER — Ambulatory Visit: Admitting: Nurse Practitioner

## 2024-08-08 ENCOUNTER — Encounter: Payer: Self-pay | Admitting: Nurse Practitioner

## 2024-08-08 VITALS — BP 108/72 | HR 59 | Temp 97.9°F | Ht 70.0 in | Wt 175.0 lb

## 2024-08-08 DIAGNOSIS — I82621 Acute embolism and thrombosis of deep veins of right upper extremity: Secondary | ICD-10-CM | POA: Diagnosis not present

## 2024-08-08 DIAGNOSIS — R079 Chest pain, unspecified: Secondary | ICD-10-CM

## 2024-08-08 MED ORDER — APIXABAN (ELIQUIS) VTE STARTER PACK (10MG AND 5MG): ORAL_TABLET | ORAL | 0 refills | Status: AC

## 2024-08-08 NOTE — Progress Notes (Signed)
 "  BP 108/72   Pulse (!) 59   Temp 97.9 F (36.6 C)   Ht 5' 10 (1.778 m)   Wt 175 lb (79.4 kg)   SpO2 97%   BMI 25.11 kg/m    Subjective:    Patient ID: Gary Nash, male    DOB: 11-22-1965, 59 y.o.   MRN: [id number]  HPI: Gary Nash is a 59 y.o. male  Chief Complaint  Patient presents with   Medical Management of Chronic Issues    Would like to discuss eliquis  and xarelto  and side affects.    Discussed the use of AI scribe software for clinical note transcription with the patient, who gave verbal consent to proceed.  History of Present Illness Gary Nash is a 59 year old male who presents for ER follow-up after being diagnosed with acute DVT in the right brachial vein.  Acute right brachial vein deep vein thrombosis (dvt) - Diagnosed with acute DVT in the right brachial vein - Initial presentation led to emergency room evaluation  Anticoagulation therapy and adverse effects - Initially started on Eliquis ; discontinued after experiencing chest pain on August 05, 2024 - CT angiogram of the chest showed no pulmonary embolism or acute pulmonary abnormalities - Refused to restart Eliquis ; switched to Pradaxa  - While on Pradaxa , developed blurred vision and right-sided headache; head CT was normal - Discontinued Pradaxa  due to worsening headaches and blurred vision, which impaired concentration and ability to work - Started on Xarelto  after discontinuing Pradaxa  - Experienced tingling and sensitivity in the fingers, particularly the left hand, which he associates with the medications - Has been taking Tylenol  for headache relief, with some benefit  Neurological and visual symptoms - Blurred vision and right-sided headache while on Pradaxa , worsening over time - Tingling and sensitivity in the fingers, especially the left hand, associated with anticoagulant use - No acute findings on two head CT scans  Impact on daily functioning - Adverse effects from  anticoagulants have significantly impaired ability to work and concentrate - Responsible for caring for his father, who is in poor health, and his handicapped brother - Expresses concern about medication side effects affecting his ability to fulfill caregiving and work responsibilities     EXAM: US  Duplex right Upper Extremity Veins.   TECHNIQUE: Real-time ultrasound scan of the veins of the right upper extremity with color Doppler flow, spectral waveform analysis and compression.   COMPARISON: None available.   CLINICAL HISTORY: 144615 Pain 144615 Pain   FINDINGS:   SUPERFICIAL VEINS: The cephalic vein has occlusive thrombus and is noncompressible. The basilic vein is compressible, and demonstrates normal color Doppler flow.   DEEP VEINS: The internal jugular, subclavian, and axillary veins are compressible, and demonstrate normal color Doppler flow. Occlusive thrombus is noted in 1 of the brachial veins in the mid and distal upper arm, which is noncompressible. Partial thrombosis of the proximal ulnar vein is noted, which is partially compressible.   SOFT TISSUES: No acute finding.   IMPRESSION: 1. Occlusive thrombus in one of the brachial veins in the mid and distal upper arm and in the cephalic vein. 2. Partial thrombosis of the proximal ulnar vein.  EXAM: 2 VIEW(S) XRAY OF THE CHEST 08/05/2024 03:16:07 PM   COMPARISON: 06/20/2024   CLINICAL HISTORY: Chest pain.   FINDINGS:   LUNGS AND PLEURA: No focal pulmonary opacity. No pleural effusion. No pneumothorax.   HEART AND MEDIASTINUM: Aortic atherosclerosis. No acute abnormality of the cardiac and mediastinal silhouettes.  BONES AND SOFT TISSUES: Multilevel thoracic osteophytosis. No acute fracture or destructive lesion.   IMPRESSION: 1. No acute cardiopulmonary pathology. 2. Aortic atherosclerosis  EXAM: CTA of the Chest with contrast for PE 08/05/2024 03:57:54 PM   TECHNIQUE: CTA of the  chest was performed after the administration of 75 mL of iohexol  (OMNIPAQUE ) 350 MG/ML injection. Multiplanar reformatted images are provided for review. MIP images are provided for review. Automated exposure control, iterative reconstruction, and/or weight based adjustment of the mA/kV was utilized to reduce the radiation dose to as low as reasonably achievable.   COMPARISON: Chest radiograph 08/05/2024 and CT chest 12/06/2016.   CLINICAL HISTORY: Pulmonary embolism (PE) suspected, high probability. Non-radiating chest pain since last night. Recently diagnosed with a blood clot in the right arm and started on eliquis .   FINDINGS:   LIMITATIONS/ARTIFACTS: Mild motion artifact.   PULMONARY ARTERIES: Pulmonary arteries are adequately opacified for evaluation. No pulmonary embolism. Main pulmonary artery is normal in caliber.   MEDIASTINUM: The heart and pericardium demonstrate no acute abnormality. Normal heart size. No pericardial effusions. There is no acute abnormality of the thoracic aorta. Normal caliber thoracic aorta. No aortic dissection. The thyroid  gland is unremarkable. The esophagus is decompressed. Gas collection adjacent to the mid esophagus may represent tortuous esophagus or mid-esophageal diverticulum. No pneumomediastinum.   LYMPH NODES: No mediastinal, hilar or axillary lymphadenopathy.   LUNGS AND PLEURA: The lungs are without acute process. Lungs are clear. No focal consolidation or pulmonary edema. No pleural effusion or pneumothorax.   UPPER ABDOMEN: Limited images of the upper abdomen are unremarkable. No acute abnormalities demonstrated in the visualized upper abdomen.   SOFT TISSUES AND BONES: Degenerative changes in the spine. No acute bony abnormalities. No acute soft tissue abnormality.   IMPRESSION: 1. No pulmonary embolism. 2. No acute pulmonary abnormality.   EXAM: CT HEAD WITHOUT CONTRAST 08/06/2024 08:41:03 PM   TECHNIQUE: CT of  the head was performed without the administration of intravenous contrast. Automated exposure control, iterative reconstruction, and/or weight based adjustment of the mA/kV was utilized to reduce the radiation dose to as low as reasonably achievable.   COMPARISON: CT head 07/27/2024.   CLINICAL HISTORY: blurred vision.   FINDINGS:   BRAIN AND VENTRICLES: No acute hemorrhage. No evidence of acute infarct. No hydrocephalus. No extra-axial collection. No mass effect or midline shift.   ORBITS: No acute abnormality.   SINUSES: No acute abnormality.   SOFT TISSUES AND SKULL: No acute soft tissue abnormality. No skull fracture.   IMPRESSION: 1. No acute intracranial abnormality.       08/02/2024   11:35 AM 11/15/2023   11:39 AM 11/15/2023   11:10 AM  Depression screen PHQ 2/9  Decreased Interest 1 0 0  Down, Depressed, Hopeless 1 0 0  PHQ - 2 Score 2 0 0  Altered sleeping 1 0 0  Tired, decreased energy 1 0 0  Change in appetite 1 0 0  Feeling bad or failure about yourself  1 0 0  Trouble concentrating 1 0 0  Moving slowly or fidgety/restless 0 0 0  Suicidal thoughts 0 0 0  PHQ-9 Score 7 0  0   Difficult doing work/chores  Not difficult at all Not difficult at all     Data saved with a previous flowsheet row definition    Relevant past medical, surgical, family and social history reviewed and updated as indicated. Interim medical history since our last visit reviewed. Allergies and medications reviewed and updated.  Review  of Systems  Ten systems reviewed and is negative except as mentioned in HPI      Objective:      BP 108/72   Pulse (!) 59   Temp 97.9 F (36.6 C)   Ht 5' 10 (1.778 m)   Wt 175 lb (79.4 kg)   SpO2 97%   BMI 25.11 kg/m    Wt Readings from Last 3 Encounters:  08/08/24 175 lb (79.4 kg)  08/06/24 175 lb (79.4 kg)  08/05/24 174 lb (78.9 kg)    Physical Exam GENERAL: Alert, cooperative, well developed, no acute distress HEENT:  Normocephalic, normal oropharynx, moist mucous membranes CHEST: Clear to auscultation bilaterally, No wheezes, rhonchi, or crackles CARDIOVASCULAR: Normal heart rate and rhythm, S1 and S2 normal without murmurs ABDOMEN: Soft, non-tender, non-distended, without organomegaly, Normal bowel sounds EXTREMITIES: No cyanosis or edema NEUROLOGICAL: Cranial nerves grossly intact, Moves all extremities without gross motor or sensory deficit  Results for orders placed or performed during the hospital encounter of 08/06/24  Basic metabolic panel   Collection Time: 08/06/24  7:56 PM  Result Value Ref Range   Sodium 137 135 - 145 mmol/L   Potassium 4.2 3.5 - 5.1 mmol/L   Chloride 104 98 - 111 mmol/L   CO2 23 22 - 32 mmol/L   Glucose, Bld 108 (H) 70 - 99 mg/dL   BUN 17 6 - 20 mg/dL   Creatinine, Ser 9.06 0.61 - 1.24 mg/dL   Calcium 8.8 (L) 8.9 - 10.3 mg/dL   GFR, Estimated >39 >39 mL/min   Anion gap 9 5 - 15  CBC   Collection Time: 08/06/24  7:56 PM  Result Value Ref Range   WBC 4.3 4.0 - 10.5 K/uL   RBC 4.22 4.22 - 5.81 MIL/uL   Hemoglobin 13.1 13.0 - 17.0 g/dL   HCT 61.6 (L) 60.9 - 47.9 %   MCV 90.8 80.0 - 100.0 fL   MCH 31.0 26.0 - 34.0 pg   MCHC 34.2 30.0 - 36.0 g/dL   RDW 86.9 88.4 - 84.4 %   Platelets 206 150 - 400 K/uL   nRBC 0.0 0.0 - 0.2 %          Assessment & Plan:   Problem List Items Addressed This Visit       Cardiovascular and Mediastinum   Acute deep vein thrombosis (DVT) of brachial vein of right upper extremity (HCC) - Primary   Relevant Medications   APIXABAN  (ELIQUIS ) VTE STARTER PACK (10MG  AND 5MG )   Other Relevant Orders   Ambulatory referral to Vascular Surgery   Other Visit Diagnoses       Chest pain, unspecified type       Relevant Orders   Ambulatory referral to Cardiology        Assessment and Plan Assessment & Plan Acute deep vein thrombosis of right upper extremity Acute DVT in the right brachial vein secondary to IV placement. Initial  treatment with Eliquis  was discontinued due to adverse effects including tingling and sensitivity in the hands. Subsequent trials with Pradaxa  and Xarelto  were also discontinued due to adverse effects such as headache and blurred vision. Concerns about the effectiveness of treatment due to inconsistent medication use. Patient requesting to speak to surgeon regarding having the clot removed. Risks of switching medications include potential for increased symptoms and ineffective treatment.  - Sent starter pack for Eliquis . - Referred to vascular specialist for evaluation of potential surgical intervention. - Advised to continue current blood thinner until vascular  consultation. - Discussed risks of switching medications and importance of consistent treatment.  Chest pain Intermittent chest pain initially evaluated in the ER with CT angiography showing no pulmonary embolism or acute pulmonary abnormalities. Chest pain attributed to physical exertion and weather conditions. No evidence of cardiac involvement on imaging. He reports er staff discussed stress test. - Referred to cardiology for stress test evaluation.        Follow up plan: Return if symptoms worsen or fail to improve. "

## 2024-08-09 ENCOUNTER — Ambulatory Visit: Admitting: Cardiology

## 2024-08-10 ENCOUNTER — Other Ambulatory Visit: Payer: Self-pay

## 2024-08-10 ENCOUNTER — Emergency Department
Admission: EM | Admit: 2024-08-10 | Discharge: 2024-08-10 | Disposition: A | Discharge status: Home / Self Care | Attending: Emergency Medicine | Admitting: Emergency Medicine

## 2024-08-10 ENCOUNTER — Emergency Department

## 2024-08-10 DIAGNOSIS — S0990XA Unspecified injury of head, initial encounter: Secondary | ICD-10-CM | POA: Insufficient documentation

## 2024-08-10 DIAGNOSIS — W228XXA Striking against or struck by other objects, initial encounter: Secondary | ICD-10-CM | POA: Diagnosis not present

## 2024-08-10 NOTE — ED Provider Notes (Signed)
 "  [facility] Provider Note    Event Date/Time   First MD Initiated Contact with Patient 08/10/24 0407     (approximate)   History   Head Injury   HPI  Gary Nash is a 59 y.o. male   Past medical history of on blood thinners and hit his head on a beam and is concerned for head bleed as he was told to come get evaluated for any minor head injury on blood thinners.  No other acute medical complaints or injuries noted by patient.  No loss of consciousness.  No vomiting.  Feels well now.     External Medical Documents Reviewed: Previous hospital notes      Physical Exam   Triage Vital Signs: ED Triage Vitals  Encounter Vitals Group     BP 08/10/24 0022 131/85     Girls Systolic BP Percentile --      Girls Diastolic BP Percentile --      Boys Systolic BP Percentile --      Boys Diastolic BP Percentile --      Pulse Rate 08/10/24 0022 (!) 54     Resp 08/10/24 0022 20     Temp 08/10/24 0022 98.1 F (36.7 C)     Temp Source 08/10/24 0022 Oral     SpO2 08/10/24 0022 98 %     Weight 08/10/24 0024 175 lb (79.4 kg)     Height 08/10/24 0024 5' 10 (1.778 m)     Head Circumference --      Peak Flow --      Pain Score 08/10/24 0026 7     Pain Loc --      Pain Education --      Exclude from Growth Chart --     Most recent vital signs: Vitals:   08/10/24 0022  BP: 131/85  Pulse: (!) 54  Resp: 20  Temp: 98.1 F (36.7 C)  SpO2: 98%    General: Awake, no distress.  CV:  Good peripheral perfusion. Resp:  Normal effort.  Abd:  No distention.  Other:  No external signs of head injury.  Pleasant patient in no acute distress with normal vital signs.  Neck supple full range of motion.   ED Results / Procedures / Treatments   Labs (all labs ordered are listed, but only abnormal results are displayed) Labs Reviewed - No data to display    RADIOLOGY I independently reviewed and interpreted CT of the head see no obvious bleeding or midline  shift I also reviewed radiologist's formal read.   PROCEDURES:  Critical Care performed: No  Procedures   MEDICATIONS ORDERED IN ED: Medications - No data to display   IMPRESSION / MDM / ASSESSMENT AND PLAN / ED COURSE  I reviewed the triage vital signs and the nursing notes.                                Patient's presentation is most consistent with acute presentation with potential threat to life or bodily function.  Differential diagnosis includes, but is not limited to, head injury including skull fracture ICH concussion   MDM:   He is on blood thinners and had a minor head injury is concern for head bleed.  Fortunately CT head looks normal.  No other injuries noted.  Appropriate for discharge.       FINAL CLINICAL IMPRESSION(S) / ED DIAGNOSES  Final diagnoses:  Minor head injury, initial encounter     Rx / DC Orders   ED Discharge Orders     None        Note:  This document was prepared using Dragon voice recognition software and may include unintentional dictation errors.    Cyrena Mylar, MD 08/10/24 0800  "

## 2024-08-10 NOTE — Discharge Instructions (Signed)
 Fortunately your CT scan did not show any bleeding or skull fracture.  Take Tylenol  650 mg every 6 hours as needed for headache.   Thank you for choosing us  for your health care today!  Please see your primary doctor this week for a follow up appointment.   If you have any new, worsening, or unexpected symptoms call your doctor right away or come back to the emergency department for reevaluation.  It was my pleasure to care for you today.   Ginnie EDISON Cyrena, MD

## 2024-08-10 NOTE — ED Triage Notes (Signed)
 Pt reports hit his head on a steel beam/piece of machinery. Pt on daily eliquis . Denies LOC at time. Reports h/a. Pt ambulatory to triage. Alert and oriented following commands. Pupils equal and reactive. Breathing unlabored speaking in full sentences with symmetric chest rise and fall.

## 2024-08-12 ENCOUNTER — Encounter: Payer: Self-pay | Admitting: Emergency Medicine

## 2024-08-12 ENCOUNTER — Emergency Department

## 2024-08-12 ENCOUNTER — Emergency Department
Admission: EM | Admit: 2024-08-12 | Discharge: 2024-08-12 | Disposition: A | Discharge status: Home / Self Care | Attending: Emergency Medicine | Admitting: Emergency Medicine

## 2024-08-12 ENCOUNTER — Other Ambulatory Visit: Payer: Self-pay

## 2024-08-12 DIAGNOSIS — R0789 Other chest pain: Secondary | ICD-10-CM | POA: Insufficient documentation

## 2024-08-12 DIAGNOSIS — R079 Chest pain, unspecified: Secondary | ICD-10-CM

## 2024-08-12 DIAGNOSIS — Z7901 Long term (current) use of anticoagulants: Secondary | ICD-10-CM | POA: Insufficient documentation

## 2024-08-12 LAB — TROPONIN T, HIGH SENSITIVITY
Troponin T High Sensitivity: 6 ng/L (ref 0–19)
Troponin T High Sensitivity: <6 ng/L (ref 0–19)

## 2024-08-12 LAB — BASIC METABOLIC PANEL WITH GFR
Anion gap: 9 (ref 5–15)
BUN: 15 mg/dL (ref 6–20)
CO2: 27 mmol/L (ref 22–32)
Calcium: 9 mg/dL (ref 8.9–10.3)
Chloride: 104 mmol/L (ref 98–111)
Creatinine, Ser: 1.05 mg/dL (ref 0.61–1.24)
GFR, Estimated: >60 mL/min
Glucose, Bld: 97 mg/dL (ref 70–99)
Potassium: 3.6 mmol/L (ref 3.5–5.1)
Sodium: 139 mmol/L (ref 135–145)

## 2024-08-12 LAB — CBC
HCT: 38.2 % — ABNORMAL LOW (ref 39.0–52.0)
Hemoglobin: 13.3 g/dL (ref 13.0–17.0)
MCH: 31.1 pg (ref 26.0–34.0)
MCHC: 34.8 g/dL (ref 30.0–36.0)
MCV: 89.5 fL (ref 80.0–100.0)
Platelets: 195 10*3/uL (ref 150–400)
RBC: 4.27 MIL/uL (ref 4.22–5.81)
RDW: 12.9 % (ref 11.5–15.5)
WBC: 2.8 10*3/uL — ABNORMAL LOW (ref 4.0–10.5)
nRBC: 0 % (ref 0.0–0.2)

## 2024-08-12 NOTE — ED Provider Notes (Signed)
 "  [facility] Provider Note    Event Date/Time   First MD Initiated Contact with Patient 08/12/24 1405     (approximate)  History   Chief Complaint: Chest Pain  HPI  Gary Nash is a 59 y.o. male with a past medical history of gastric reflux, right upper extremity DVT, presents to the emergency department for some chest discomfort.  According to the patient over the past week or so he has been experiencing some mild chest discomfort was recently diagnosed with right upper extremity DVT and placed on Eliquis .  Patient states since starting the Eliquis  4 days ago he has been experiencing some muscle type aches sometimes to his arms sometimes to his chest.  I reviewed the patient's chart he had a CTA performed 5 days ago that was negative for PE.  His vital signs are reassuring in the emergency department occluding normal pulse rate respiratory rate and 99% room air saturation.  Patient denies any pleuritic pain.  Physical Exam   Triage Vital Signs: ED Triage Vitals  Encounter Vitals Group     BP 08/12/24 1238 (!) 142/76     Girls Systolic BP Percentile --      Girls Diastolic BP Percentile --      Boys Systolic BP Percentile --      Boys Diastolic BP Percentile --      Pulse Rate 08/12/24 1238 62     Resp 08/12/24 1238 18     Temp 08/12/24 1238 98 F (36.7 C)     Temp src --      SpO2 08/12/24 1238 99 %     Weight --      Height --      Head Circumference --      Peak Flow --      Pain Score 08/12/24 1236 7     Pain Loc --      Pain Education --      Exclude from Growth Chart --     Most recent vital signs: Vitals:   08/12/24 1238  BP: (!) 142/76  Pulse: 62  Resp: 18  Temp: 98 F (36.7 C)  SpO2: 99%    General: Awake, no distress.  CV:  Good peripheral perfusion.  Regular rate and rhythm  Resp:  Normal effort.  Equal breath sounds bilaterally.  Abd:  No distention.  Soft, nontender.    ED Results / Procedures / Treatments    EKG  EKG viewed and interpreted by myself shows sinus bradycardia 55 bpm with a narrow QRS, normal axis, normal intervals, no concerning ST changes.  RADIOLOGY  I have reviewed interpret the chest x-ray images.  No consolidation seen on my evaluation. Radiology has read the x-ray is negative   MEDICATIONS ORDERED IN ED: Medications - No data to display   IMPRESSION / MDM / ASSESSMENT AND PLAN / ED COURSE  I reviewed the triage vital signs and the nursing notes.  Patient's presentation is most consistent with acute presentation with potential threat to life or bodily function.  Patient presents to the emergency department for chest pain as well as arm pain intermittent over the past week or so.  Patient recent diagnosed a right upper extremity DVT placed on Eliquis .  Patient is taking Eliquis  he relates the muscular pains possibly to the Eliquis .  Patient states his big concern was just making sure his heart was okay.  Patient's physical exam is reassuring, vital signs are reassuring including normal  pulse rate respiratory rate and 99% room air saturation.  Patient's labs are resulted showing a reassuring CBC reassuring chemistry and a negative troponin.  Will recheck a troponin as a precaution.  If the patient's repeat troponin remains negative I believe the patient could be safely discharged home with outpatient follow-up.  Patient is agreeable to this plan as well states he has an outpatient vascular follow-up this week.  Chest x-ray is clear and EKG reassuring.  FINAL CLINICAL IMPRESSION(S) / ED DIAGNOSES   Chest pain   Note:  This document was prepared using Dragon voice recognition software and may include unintentional dictation errors.   Dorothyann Drivers, MD 08/12/24 1416  "

## 2024-08-12 NOTE — Discharge Instructions (Signed)
 Please follow-up with your doctor regarding today's ER visit.  Return to the emergency department for any worsening chest pain any trouble breathing or any other symptom personally concerning to yourself.

## 2024-08-13 ENCOUNTER — Ambulatory Visit (INDEPENDENT_AMBULATORY_CARE_PROVIDER_SITE_OTHER): Payer: Self-pay | Admitting: Vascular Surgery

## 2024-08-13 ENCOUNTER — Encounter (INDEPENDENT_AMBULATORY_CARE_PROVIDER_SITE_OTHER): Payer: Self-pay | Admitting: Vascular Surgery

## 2024-08-13 ENCOUNTER — Telehealth (INDEPENDENT_AMBULATORY_CARE_PROVIDER_SITE_OTHER): Payer: Self-pay | Admitting: Nurse Practitioner

## 2024-08-13 VITALS — BP 107/66 | HR 54 | Ht 70.0 in | Wt 171.0 lb

## 2024-08-13 DIAGNOSIS — I82621 Acute embolism and thrombosis of deep veins of right upper extremity: Secondary | ICD-10-CM

## 2024-08-13 NOTE — Telephone Encounter (Signed)
Patient is currently here in office

## 2024-08-16 ENCOUNTER — Ambulatory Visit: Admitting: Cardiology

## 2024-09-10 ENCOUNTER — Ambulatory Visit (INDEPENDENT_AMBULATORY_CARE_PROVIDER_SITE_OTHER): Admitting: Vascular Surgery

## 2024-09-10 ENCOUNTER — Encounter (INDEPENDENT_AMBULATORY_CARE_PROVIDER_SITE_OTHER)
